# Patient Record
Sex: Female | Born: 1935 | Race: White | Hispanic: No | Smoking: Never smoker
Health system: Southern US, Community
[De-identification: ages and names within clinical notes are randomized; demographics above are authoritative.]

## PROBLEM LIST (undated history)

## (undated) DIAGNOSIS — M1990 Unspecified osteoarthritis, unspecified site: Secondary | ICD-10-CM

## (undated) DIAGNOSIS — H269 Unspecified cataract: Secondary | ICD-10-CM

## (undated) DIAGNOSIS — I509 Heart failure, unspecified: Secondary | ICD-10-CM

## (undated) HISTORY — PX: BREAST BIOPSY: SHX20

## (undated) HISTORY — DX: Unspecified cataract: H26.9

## (undated) HISTORY — DX: Unspecified osteoarthritis, unspecified site: M19.90

## (undated) HISTORY — DX: Heart failure, unspecified: I50.9

---
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
## 2001-04-13 HISTORY — PX: REPLACEMENT TOTAL KNEE BILATERAL: SUR1225

## 2003-04-14 HISTORY — PX: BACK SURGERY: SHX140

## 2016-11-06 DIAGNOSIS — I890 Lymphedema, not elsewhere classified: Secondary | ICD-10-CM | POA: Insufficient documentation

## 2016-11-06 DIAGNOSIS — M79605 Pain in left leg: Secondary | ICD-10-CM | POA: Insufficient documentation

## 2019-12-30 DIAGNOSIS — Z20822 Contact with and (suspected) exposure to covid-19: Secondary | ICD-10-CM | POA: Insufficient documentation

## 2020-10-25 ENCOUNTER — Telehealth: Payer: Self-pay

## 2020-10-25 NOTE — Telephone Encounter (Signed)
Yes it is ok to schedule a new patient appointment.  Katelyn Estes. Jimmey Ralph, MD 10/25/2020 12:29 PM

## 2020-10-25 NOTE — Telephone Encounter (Signed)
Patient has been scheduled for 01/24/21 

## 2020-10-25 NOTE — Telephone Encounter (Signed)
Patient is calling in wanting to establish with Dr.Parker, was recommended to him by Dr.Bagely. Okay to schedule appointment?

## 2020-12-06 ENCOUNTER — Telehealth: Payer: Self-pay | Admitting: Cardiovascular Disease

## 2020-12-06 NOTE — Telephone Encounter (Signed)
Patient called and made mention that she has been trying for weeks to get scheduled and get referral into office by previous cardiologist in [place]. Says that she finally got in contact with office and they need a request to send medical records and referral to office. Said to send in request via fax to [phone]; attn: Dr. Virgina Organ

## 2020-12-09 ENCOUNTER — Encounter: Payer: Self-pay | Admitting: *Deleted

## 2020-12-09 ENCOUNTER — Other Ambulatory Visit: Payer: Self-pay | Admitting: *Deleted

## 2020-12-11 ENCOUNTER — Telehealth: Payer: Self-pay

## 2020-12-11 NOTE — Telephone Encounter (Signed)
Katelyn Estes called in stating that she is a new pt with Dr Jimmey Ralph. New pt appt is scheduled for 01/13/21. [name] called that we need to request her records from her previous doctor in [place]. Pt stated that she has brought in new pt paper work already. [name] stated that the ROI needs to be faxed to [phone] from Dr Tresa Endo. Pt would also like to be seen before 10/3. Please Advise.

## 2020-12-11 NOTE — Telephone Encounter (Signed)
Ok to see pt before 10/03?

## 2020-12-12 NOTE — Telephone Encounter (Signed)
Referral notes received from [facility], Phone #: [phone], Fax #: [phone]   A copy of the notes have been placed in the scheduling box for check-out to pick-up and to enter referral. Original notes placed in file cabinet.   Sent a request for records from previous cardiologist in [place]. Fax [phone]: attn Dr. Virgina Organ

## 2020-12-17 NOTE — Telephone Encounter (Signed)
Doesn't look like we have any 40 min slots before her appt.

## 2020-12-17 NOTE — Telephone Encounter (Signed)
Ok to put her in a 40 minute slot if we have anything available before October.  Katina Degree. Jimmey Ralph, MD 12/17/2020 7:58 AM

## 2020-12-17 NOTE — Telephone Encounter (Signed)
See below

## 2020-12-23 ENCOUNTER — Telehealth: Payer: Self-pay

## 2020-12-23 NOTE — Telephone Encounter (Signed)
NOTES ON FILE FROM [facility] [phone]

## 2021-01-13 ENCOUNTER — Ambulatory Visit (INDEPENDENT_AMBULATORY_CARE_PROVIDER_SITE_OTHER): Payer: Medicare Other | Admitting: Family Medicine

## 2021-01-13 ENCOUNTER — Other Ambulatory Visit: Payer: Self-pay

## 2021-01-13 ENCOUNTER — Telehealth: Payer: Self-pay

## 2021-01-13 ENCOUNTER — Encounter: Payer: Self-pay | Admitting: Family Medicine

## 2021-01-13 VITALS — BP 114/75 | HR 95 | Temp 98.5°F | Ht 62.0 in | Wt 191.8 lb

## 2021-01-13 DIAGNOSIS — L989 Disorder of the skin and subcutaneous tissue, unspecified: Secondary | ICD-10-CM | POA: Diagnosis not present

## 2021-01-13 DIAGNOSIS — E538 Deficiency of other specified B group vitamins: Secondary | ICD-10-CM | POA: Diagnosis not present

## 2021-01-13 DIAGNOSIS — E785 Hyperlipidemia, unspecified: Secondary | ICD-10-CM | POA: Diagnosis not present

## 2021-01-13 DIAGNOSIS — G4700 Insomnia, unspecified: Secondary | ICD-10-CM

## 2021-01-13 DIAGNOSIS — R5383 Other fatigue: Secondary | ICD-10-CM

## 2021-01-13 DIAGNOSIS — J309 Allergic rhinitis, unspecified: Secondary | ICD-10-CM

## 2021-01-13 DIAGNOSIS — I25119 Atherosclerotic heart disease of native coronary artery with unspecified angina pectoris: Secondary | ICD-10-CM

## 2021-01-13 DIAGNOSIS — R739 Hyperglycemia, unspecified: Secondary | ICD-10-CM | POA: Diagnosis not present

## 2021-01-13 DIAGNOSIS — I2510 Atherosclerotic heart disease of native coronary artery without angina pectoris: Secondary | ICD-10-CM | POA: Insufficient documentation

## 2021-01-13 DIAGNOSIS — I890 Lymphedema, not elsewhere classified: Secondary | ICD-10-CM | POA: Insufficient documentation

## 2021-01-13 DIAGNOSIS — E559 Vitamin D deficiency, unspecified: Secondary | ICD-10-CM | POA: Diagnosis not present

## 2021-01-13 DIAGNOSIS — I10 Essential (primary) hypertension: Secondary | ICD-10-CM

## 2021-01-13 DIAGNOSIS — M1990 Unspecified osteoarthritis, unspecified site: Secondary | ICD-10-CM

## 2021-01-13 DIAGNOSIS — K5900 Constipation, unspecified: Secondary | ICD-10-CM

## 2021-01-13 DIAGNOSIS — G4733 Obstructive sleep apnea (adult) (pediatric): Secondary | ICD-10-CM

## 2021-01-13 LAB — LIPID PANEL
Cholesterol: 185 mg/dL (ref 0–200)
HDL: 76.8 mg/dL (ref >39.00)
LDL Cholesterol: 88 mg/dL (ref 0–99)
NonHDL: 108.11
Total CHOL/HDL Ratio: 2
Triglycerides: 99 mg/dL (ref 0.0–149.0)
VLDL: 19.8 mg/dL (ref 0.0–40.0)

## 2021-01-13 LAB — COMPREHENSIVE METABOLIC PANEL
ALT: 15 U/L (ref 0–35)
AST: 17 U/L (ref 0–37)
Albumin: 4 g/dL (ref 3.5–5.2)
Alkaline Phosphatase: 57 U/L (ref 39–117)
BUN: 12 mg/dL (ref 6–23)
CO2: 28 mEq/L (ref 19–32)
Calcium: 9.4 mg/dL (ref 8.4–10.5)
Chloride: 102 mEq/L (ref 96–112)
Creatinine, Ser: 0.62 mg/dL (ref 0.40–1.20)
GFR: 79.62 mL/min (ref >60.00)
Glucose, Bld: 81 mg/dL (ref 70–99)
Potassium: 4.1 mEq/L (ref 3.5–5.1)
Sodium: 140 mEq/L (ref 135–145)
Total Bilirubin: 0.4 mg/dL (ref 0.2–1.2)
Total Protein: 6.1 g/dL (ref 6.0–8.3)

## 2021-01-13 LAB — HEMOGLOBIN A1C: Hgb A1c MFr Bld: 5.9 % (ref 4.6–6.5)

## 2021-01-13 LAB — CBC
HCT: 41.2 % (ref 36.0–46.0)
Hemoglobin: 13.6 g/dL (ref 12.0–15.0)
MCHC: 33 g/dL (ref 30.0–36.0)
MCV: 95.8 fl (ref 78.0–100.0)
Platelets: 230 10*3/uL (ref 150.0–400.0)
RBC: 4.31 Mil/uL (ref 3.87–5.11)
RDW: 13.3 % (ref 11.5–15.5)
WBC: 4.1 10*3/uL (ref 4.0–10.5)

## 2021-01-13 LAB — VITAMIN B12: Vitamin B-12: 211 pg/mL (ref 211–911)

## 2021-01-13 LAB — VITAMIN D 25 HYDROXY (VIT D DEFICIENCY, FRACTURES): VITD: 40.4 ng/mL (ref 30.00–100.00)

## 2021-01-13 LAB — TSH: TSH: 2.03 u[IU]/mL (ref 0.35–5.50)

## 2021-01-13 MED ORDER — AZELASTINE HCL 0.1 % NA SOLN: 2.0000 | Freq: Two times a day (BID) | NASAL | 0 refills | Status: DC

## 2021-01-13 NOTE — Assessment & Plan Note (Signed)
Check vitamin D. 

## 2021-01-13 NOTE — Assessment & Plan Note (Signed)
Needs assistive devices to walk.  Managed with over-the-counter meds.  We will fill out handicap placard form.

## 2021-01-13 NOTE — Assessment & Plan Note (Signed)
Discussed sleep hygiene measures.  Recommend avoidance of caffeine.  She can try melatonin over-the-counter as needed.

## 2021-01-13 NOTE — Assessment & Plan Note (Signed)
Uses over-the-counter laxative.  Symptoms are currently stable.

## 2021-01-13 NOTE — Telephone Encounter (Signed)
FYI:  Patient wrote in two previous MD's on DPR thinking that the DPR was a medical records release form.   Patient is going to come back into the office at a later date to complete both.

## 2021-01-13 NOTE — Assessment & Plan Note (Signed)
Start Astelin nasal spray.   

## 2021-01-13 NOTE — Assessment & Plan Note (Signed)
Managed conservatively.  Uses sequential compression devices at home.

## 2021-01-13 NOTE — Assessment & Plan Note (Signed)
Does not use CPAP. 

## 2021-01-13 NOTE — Assessment & Plan Note (Signed)
Has been prescribed pravastatin in the past but is currently no longer on any medication for this.  We will check labs.

## 2021-01-13 NOTE — Assessment & Plan Note (Signed)
At goal off medications. 

## 2021-01-13 NOTE — Progress Notes (Signed)
Katelyn Estes is a 87 y.o. female who presents today for an office visit.  Assessment/Plan:  New/Acute Problems: Skin Lesion Will place referral to dermatology.  Likely benign epidermal cyst.  Chronic Problems Addressed Today: Allergic rhinitis Start Astelin nasal spray.  Vitamin D deficiency Check vitamin D.  Lymphedema Managed conservatively.  Uses sequential compression devices at home.  Constipation Uses over-the-counter laxative.  Symptoms are currently stable.  Osteoarthritis Needs assistive devices to walk.  Managed with over-the-counter meds.  We will fill out handicap placard form.   OSA (obstructive sleep apnea) Does not use CPAP.   Hyperglycemia Check A1c.   Dyslipidemia Has been prescribed pravastatin in the past but is currently no longer on any medication for this.  We will check labs.  Coronary artery disease We following up with cardiology soon.  She is on Plavix 75 mg daily.  Essential hypertension At goal off medications.   Insomnia Discussed sleep hygiene measures.  Recommend avoidance of caffeine.  She can try melatonin over-the-counter as needed.     Subjective:  HPI:  Patient is here to establish care.  Recently relocated to the area.  See A/P for status of chronic conditions.  ROS: Per HPI, otherwise a complete review of systems was negative.   PMH:  The following were reviewed and entered/updated in epic: Past Medical History:  Diagnosis Date   Arthritis    Cataracts, bilateral    Congestive heart disease (HCC)    Patient Active Problem List   Diagnosis Date Noted   Essential hypertension 01/13/2021   Coronary artery disease 01/13/2021   Dyslipidemia 01/13/2021   Hyperglycemia 01/13/2021   OSA (obstructive sleep apnea) 01/13/2021   Osteoarthritis 01/13/2021   Constipation 01/13/2021   Lymphedema 01/13/2021   Vitamin D deficiency 01/13/2021   Allergic rhinitis 01/13/2021   Insomnia 01/13/2021   Past Surgical History:   Procedure Laterality Date   BACK SURGERY  2005   REPLACEMENT TOTAL KNEE BILATERAL  2003   TONSILLECTOMY  1939    History reviewed. No pertinent family history.  Medications- reviewed and updated Current Outpatient Medications  Medication Sig Dispense Refill   ammonium lactate (LAC-HYDRIN) 12 % lotion Apply topically daily.     azelastine (ASTELIN) 0.1 % nasal spray Place 2 sprays into both nostrils 2 (two) times daily. 30 mL 0   clopidogrel (PLAVIX) 75 MG tablet Take 75 mg by mouth daily.     zinc gluconate 50 MG tablet Take 50 mg by mouth daily.     No current facility-administered medications for this visit.    Allergies-reviewed and updated No Known Allergies  Social History   Socioeconomic History   Marital status: Widowed    Spouse name: Not on file   Number of children: Not on file   Years of education: Not on file   Highest education level: Not on file  Occupational History   Not on file  Tobacco Use   Smoking status: Never   Smokeless tobacco: Not on file  Substance and Sexual Activity   Alcohol use: Yes    Comment: occ   Drug use: Never   Sexual activity: Not on file  Other Topics Concern   Not on file  Social History Narrative   Not on file   Social Determinants of Health   Financial Resource Strain: Not on file  Food Insecurity: Not on file  Transportation Needs: Not on file  Physical Activity: Not on file  Stress: Not on file  Social Connections: Not  on file          Objective:  Physical Exam: BP 114/75   Pulse 95   Temp 98.5 F (36.9 C) (Temporal)   Ht 5\' 2"  (1.575 m)   Wt 191 lb 12.8 oz (87 kg)   SpO2 95%   BMI 35.08 kg/m   Gen: No acute distress, resting comfortably CV: Regular rate and rhythm with no murmurs appreciated Pulm: Normal work of breathing, clear to auscultation bilaterally with no crackles, wheezes, or rhonchi Neuro: Grossly normal, moves all extremities Psych: Normal affect and thought content      Time  Spent: 65 minutes of total time was spent on the date of the encounter performing the following actions: chart review prior to seeing the patient including records from previous PCP, obtaining history, performing a medically necessary exam, counseling on the treatment plan, placing orders, and documenting in our EHR.    . Katina Degree, MD 01/13/2021 1:52 PM

## 2021-01-13 NOTE — Assessment & Plan Note (Signed)
Check A1c. 

## 2021-01-13 NOTE — Patient Instructions (Signed)
It was very nice to see you today!  Please try the Astelin nasal spray for your runny nose.  Please try taking melatonin to help you sleep.  We will check blood work today.  I will refer you to see a dermatologist.  I will see back in 6 months.  Please come back to see me sooner if needed.  Take care, Dr Jimmey Ralph  PLEASE NOTE:  If you had any lab tests please let us know if you have not heard back within a few days. You may see your results on mychart before we have a chance to review them but we will give you a call once they are reviewed by [place]. If we ordered any referrals today, please let us know if you have not heard from their office within the next week.   Please try these tips to maintain a healthy lifestyle:  Eat at least 3 REAL meals and 1-2 snacks per day.  Aim for no more than 5 hours between eating.  If you eat breakfast, please do so within one hour of getting up.   Each meal should contain half fruits/vegetables, one quarter protein, and one quarter carbs (no bigger than a computer mouse)  Cut down on sweet beverages. This includes juice, soda, and sweet tea.   Drink at least 1 glass of water with each meal and aim for at least 8 glasses per day  Exercise at least 150 minutes every week.

## 2021-01-13 NOTE — Assessment & Plan Note (Signed)
We following up with cardiology soon.  She is on Plavix 75 mg daily.

## 2021-01-14 NOTE — Progress Notes (Signed)
Please inform patient of the following:  Her B12 is low. This could explain some of her symptoms. Recommend starting B12 protocol. Everything else is NORMAL. We can recheck everything else in a year.

## 2021-01-15 ENCOUNTER — Telehealth: Payer: Self-pay

## 2021-01-15 NOTE — Telephone Encounter (Signed)
Called and spoke with pt and labs reviewed. 

## 2021-01-15 NOTE — Telephone Encounter (Signed)
Patient is returning a call about lab results.  

## 2021-01-16 ENCOUNTER — Telehealth: Payer: Self-pay

## 2021-01-16 DIAGNOSIS — I890 Lymphedema, not elsewhere classified: Secondary | ICD-10-CM

## 2021-01-16 NOTE — Telephone Encounter (Signed)
We can refer to wound care clinic if she wishes.  Katina Degree. Jimmey Ralph, MD 01/16/2021 1:26 PM

## 2021-01-16 NOTE — Telephone Encounter (Signed)
Patient was seen on 10/3 and discussed referral and would like to gt a referral to a dr. To help with her lymphoedema in her legs  Please advise

## 2021-01-16 NOTE — Telephone Encounter (Signed)
Who would this referral be sent to?

## 2021-01-16 NOTE — Telephone Encounter (Signed)
Referral to wound care has been placed.

## 2021-01-20 ENCOUNTER — Telehealth: Payer: Self-pay

## 2021-01-20 ENCOUNTER — Telehealth: Payer: Self-pay | Admitting: Physician Assistant

## 2021-01-20 NOTE — Telephone Encounter (Signed)
The [facility] website says that the [facility] will treat lymphedema. Ok to place referral there.  Katina Degree. Jimmey Ralph, MD 01/20/2021 2:43 PM

## 2021-01-20 NOTE — Telephone Encounter (Signed)
See below, you had me refer to wound clinic last week.

## 2021-01-20 NOTE — Telephone Encounter (Signed)
Patient is calling in stating that she wanted a referral for her lymphedema but keeps getting referred to places that do not treat it. Wanting to know what she needs to do.

## 2021-01-20 NOTE — Telephone Encounter (Signed)
Notes documented and referral marked as "patient refusal" that will automatically send referral back to referring office.

## 2021-01-20 NOTE — Telephone Encounter (Signed)
Do you know how I place/order this referral?

## 2021-01-20 NOTE — Telephone Encounter (Signed)
Patient canceled appointment because she was to be seen for lymphodema by someone else.  Patient states she will contact Dr. Lavone Neri office to get referral for that.

## 2021-01-24 ENCOUNTER — Ambulatory Visit: Payer: Self-pay | Admitting: Family Medicine

## 2021-01-29 NOTE — Telephone Encounter (Signed)
Patient called in saying she has already contacted [place] before about the referral, she has gotten another call from [facility] that they cant see her and patient would like for someone to give her a call about an update.

## 2021-02-03 ENCOUNTER — Ambulatory Visit (INDEPENDENT_AMBULATORY_CARE_PROVIDER_SITE_OTHER): Payer: Medicare Other | Admitting: Orthopedic Surgery

## 2021-02-03 ENCOUNTER — Encounter: Payer: Self-pay | Admitting: Orthopedic Surgery

## 2021-02-03 DIAGNOSIS — I25119 Atherosclerotic heart disease of native coronary artery with unspecified angina pectoris: Secondary | ICD-10-CM

## 2021-02-03 DIAGNOSIS — I890 Lymphedema, not elsewhere classified: Secondary | ICD-10-CM

## 2021-02-03 NOTE — Progress Notes (Signed)
Office Visit Note   Patient: Katelyn Estes           Date of Birth: March 17, 1931           MRN: [id number] Visit Date: 02/03/2021              Requested by: Ardith Dark, MD [address] [place],  [place] [address] PCP: Ardith Dark, MD  Chief Complaint  Patient presents with   Right Leg - Pain   Left Leg - Pain      HPI: Patient is a 87 year old woman who was seen for initial evaluation for lymphedema of both lower extremities.  Patient has been using a lymphedema pump since 2016 patient states the pump and compression garments no longer work properly.  Assessment & Plan: Visit Diagnoses:  1. Lymphedema     Plan: Patient is provided a prescription for new pump and lymphedema garments.  Recommended also use a compression and using a stationary bike to work the calf pump.  Follow-Up Instructions: Return if symptoms worsen or fail to improve.   Ortho Exam  Patient is alert, oriented, no adenopathy, well-dressed, normal affect, normal respiratory effort. Examination patient does not have brawny skin color changes she does have pitting edema both lower extremities there is no redness no cellulitis she has a palpable pulse bilaterally.  Patient is status post bilateral total knees.  She has been using a lymphedema pump since 2016 but they no longer work.  Imaging: No results found. No images are attached to the encounter.  Labs: Lab Results  Component Value Date   HGBA1C 5.9 01/13/2021     Lab Results  Component Value Date   ALBUMIN 4.0 01/13/2021    No results found for: MG Lab Results  Component Value Date   VD25OH 40.40 01/13/2021    No results found for: PREALBUMIN CBC EXTENDED Latest Ref Rng & Units 01/13/2021  WBC 4.0 - 10.5 K/uL 4.1  RBC 3.87 - 5.11 Mil/uL 4.31  HGB 12.0 - 15.0 g/dL 66.4  HCT 40.3 - 47.4 % 41.2  PLT 150.0 - 400.0 K/uL 230.0     There is no height or weight on file to calculate BMI.  Orders:  No orders of the defined types were  placed in this encounter.  No orders of the defined types were placed in this encounter.    Procedures: No procedures performed  Clinical Data: No additional findings.  ROS:  All other systems negative, except as noted in the HPI. Review of Systems  Objective: Vital Signs: There were no vitals taken for this visit.  Specialty Comments:  No specialty comments available.  PMFS History: Patient Active Problem List   Diagnosis Date Noted   Essential hypertension 01/13/2021   Coronary artery disease 01/13/2021   Dyslipidemia 01/13/2021   Hyperglycemia 01/13/2021   OSA (obstructive sleep apnea) 01/13/2021   Osteoarthritis 01/13/2021   Constipation 01/13/2021   Lymphedema 01/13/2021   Vitamin D deficiency 01/13/2021   Allergic rhinitis 01/13/2021   Insomnia 01/13/2021   Past Medical History:  Diagnosis Date   Arthritis    Cataracts, bilateral    Congestive heart disease (HCC)     History reviewed. No pertinent family history.  Past Surgical History:  Procedure Laterality Date   BACK SURGERY  2005   REPLACEMENT TOTAL KNEE BILATERAL  2003   TONSILLECTOMY  1939   Social History   Occupational History   Not on file  Tobacco Use   Smoking status: Never  Smokeless tobacco: Not on file  Substance and Sexual Activity   Alcohol use: Yes    Comment: occ   Drug use: Never   Sexual activity: Not on file

## 2021-02-05 ENCOUNTER — Other Ambulatory Visit: Payer: Self-pay | Admitting: Family Medicine

## 2021-02-11 NOTE — Progress Notes (Signed)
CARDIOLOGY CONSULT NOTE       Patient ID: Katelyn Estes MRN: [id number] DOB/AGE: 11-27-1930 87 y.o.  Admit date: (Not on file) Referring Physician: Jimmey Ralph Primary Physician: Ardith Dark, MD Primary Cardiologist: New Reason for Consultation: CAD/Lymphedema  Active Problems:   * No active hospital problems. *   HPI:  87 y.o. referred by Dr Jimmey Ralph for CAD. She has chronic lymphedema using pump since 2016 and has seen Dr Lajoyce Corners , wound center and oncology lymphedema center Reviewed notes from [facility] History of stent to mid LAD.  Last cath 2017 with patent stent and moderate dx in proximal LAD and proximal RCA. FFR negative stent patent March 2021 lexiscan myovue normal Echo's have shown normal EF and trace MR. Chronic LE edema, dyspnea and atypical sharp chest pains better with Ranexa Tends to be bradycardic previous monitor with no AV block and self limited SVT. She is overweight and has OSA with recent use of CPAP  Lab review normal including CBC/BMET LFTs with LDL 88  HR is high not on beta blocker Needs new nitro No angina  Use to work for [employer] in [employer] Widowed over 45 years Has 4 daughters 3 in [place] And moved to town home here. Having trouble getting socially integrated and finding People to play cards with   ROS All other systems reviewed and negative except as noted above  Past Medical History:  Diagnosis Date   Arthritis    Cataracts, bilateral    Congestive heart disease (HCC)     No family history on file.  Social History   Socioeconomic History   Marital status: Widowed    Spouse name: Not on file   Number of children: Not on file   Years of education: Not on file   Highest education level: Not on file  Occupational History   Not on file  Tobacco Use   Smoking status: Never   Smokeless tobacco: Never  Substance and Sexual Activity   Alcohol use: Yes    Comment: occ   Drug use: Never   Sexual activity: Not on file  Other Topics Concern   Not on  file  Social History Narrative   Not on file   Social Determinants of Health   Financial Resource Strain: Not on file  Food Insecurity: Not on file  Transportation Needs: Not on file  Physical Activity: Not on file  Stress: Not on file  Social Connections: Not on file  Intimate Partner Violence: Not on file    Past Surgical History:  Procedure Laterality Date   BACK SURGERY  2005   REPLACEMENT TOTAL KNEE BILATERAL  2003   TONSILLECTOMY  1939      Current Outpatient Medications:    ammonium lactate (LAC-HYDRIN) 12 % lotion, Apply topically daily., Disp: , Rfl:    clopidogrel (PLAVIX) 75 MG tablet, Take 75 mg by mouth daily., Disp: , Rfl:    zinc gluconate 50 MG tablet, Take 50 mg by mouth daily. (Patient not taking: Reported on 02/18/2021), Disp: , Rfl:     Physical Exam: Blood pressure 110/64, pulse 94, height 5\' 2"  (1.575 m), weight 190 lb (86.2 kg), SpO2 97 %.    Affect appropriate Overweight elderly female  HEENT: normal Neck supple with no adenopathy JVP normal no bruits no thyromegaly Lungs clear with no wheezing and good diaphragmatic motion Heart:  S1/S2 no murmur, no rub, gallop or click PMI normal Abdomen: benighn, BS positve, no tenderness, no AAA no bruit.  No  HSM or HJR Distal pulses intact with no bruits Bilateral lymphedema prominent over feet Neuro non-focal Skin warm and dry No muscular weakness   Labs:   Lab Results  Component Value Date   WBC 4.1 01/13/2021   HGB 13.6 01/13/2021   HCT 41.2 01/13/2021   MCV 95.8 01/13/2021   PLT 230.0 01/13/2021   No results for input(s): NA, K, CL, CO2, BUN, CREATININE, CALCIUM, PROT, BILITOT, ALKPHOS, ALT, AST, GLUCOSE in the last 168 hours.  Invalid input(s): LABALBU No results found for: CKTOTAL, CKMB, CKMBINDEX, TROPONINI  Lab Results  Component Value Date   CHOL 185 01/13/2021   Lab Results  Component Value Date   HDL 76.80 01/13/2021   Lab Results  Component Value Date   LDLCALC 88  01/13/2021   Lab Results  Component Value Date   TRIG 99.0 01/13/2021   Lab Results  Component Value Date   CHOLHDL 2 01/13/2021   No results found for: LDLDIRECT    Radiology: No results found.  EKG: SR rate 94 LAD    ASSESSMENT AND PLAN:   CAD: stent to mid LAD with non ischemic myovue March 2021 Historically on plavix monotherapy Start coreg 3.125 bid New nitro called in  Lymphedema:  Not on diuretic sees lymphedema clinic has pump will f/u with wound center Indicates getting a new machine next week  OSA:  discussed weight loss and CPAP Bradycardia:  history of Tachycardic today see above regarding low dose coreg  HLD:  LDL 88  f/u primary defers statin given age   Coreg 3.125 bid Nitro  F/U 3 months   Signed: Charlton Haws 02/18/2021, 10:28 AM

## 2021-02-18 ENCOUNTER — Encounter: Payer: Self-pay | Admitting: Cardiovascular Disease

## 2021-02-18 ENCOUNTER — Ambulatory Visit (INDEPENDENT_AMBULATORY_CARE_PROVIDER_SITE_OTHER): Payer: Medicare Other | Admitting: Cardiovascular Disease

## 2021-02-18 ENCOUNTER — Other Ambulatory Visit: Payer: Self-pay

## 2021-02-18 VITALS — BP 110/64 | HR 94 | Ht 62.0 in | Wt 190.0 lb

## 2021-02-18 DIAGNOSIS — I10 Essential (primary) hypertension: Secondary | ICD-10-CM

## 2021-02-18 DIAGNOSIS — I25119 Atherosclerotic heart disease of native coronary artery with unspecified angina pectoris: Secondary | ICD-10-CM | POA: Diagnosis not present

## 2021-02-18 DIAGNOSIS — I890 Lymphedema, not elsewhere classified: Secondary | ICD-10-CM | POA: Diagnosis not present

## 2021-02-18 MED ORDER — CARVEDILOL 3.125 MG PO TABS: 3.1250 mg | ORAL_TABLET | Freq: Two times a day (BID) | ORAL | 3 refills | Status: DC

## 2021-02-18 MED ORDER — NITROGLYCERIN 0.4 MG SL SUBL: 0.4000 mg | SUBLINGUAL_TABLET | SUBLINGUAL | 3 refills | Status: DC | PRN

## 2021-02-18 NOTE — Patient Instructions (Addendum)
Medication Instructions:  Your physician has recommended you make the following change in your medication:  1-START Carvedilol 3.125 mg by mouth twice daily (Take only once a day for the first 2 weeks) 2-Take 1 nitroglycerin (NTG), under your tongue, while sitting.  If no relief of pain may repeat NTG, one tab every 5 minutes up to 3 tablets total over 15 minutes.  If no relief CALL 911.  If you have dizziness/lightheadness  while taking NTG, stop taking and call 911.    *If you need a refill on your cardiac medications before your next appointment, please call your pharmacy*  Lab Work: If you have labs (blood work) drawn today and your tests are completely normal, you will receive your results only by: MyChart Message (if you have MyChart) OR A paper copy in the mail If you have any lab test that is abnormal or we need to change your treatment, we will call you to review the results.  Testing/Procedures: None ordered at this time.  Follow-Up: At [facility], you and your health needs are our priority.  As part of our continuing mission to provide you with exceptional heart care, we have created designated Provider Care Teams.  These Care Teams include your primary Cardiologist (physician) and Advanced Practice Providers (APPs -  Physician Assistants and Nurse Practitioners) who all work together to provide you with the care you need, when you need it.  We recommend signing up for the patient portal called "MyChart".  Sign up information is provided on this After Visit Summary.  MyChart is used to connect with patients for Virtual Visits (Telemedicine).  Patients are able to view lab/test results, encounter notes, upcoming appointments, etc.  Non-urgent messages can be sent to your provider as well.   To learn more about what you can do with MyChart, go to [email or web address].    Your next appointment:   3 months  The format for your next appointment:   In Person  Provider:    Charlton Haws, MD

## 2021-02-24 ENCOUNTER — Telehealth: Payer: Self-pay | Admitting: Orthopedic Surgery

## 2021-02-24 NOTE — Telephone Encounter (Signed)
I emailed Megan on Friday and advised that I have printed the CMN and the rx and will have Dr. Lajoyce Corners to sign tonight and then will fax in the morning.

## 2021-02-24 NOTE — Telephone Encounter (Signed)
Christine with tactile states hey need a Dr.s signature for a device for the pt. She would like a [employer] CB# [phone]

## 2021-02-26 ENCOUNTER — Telehealth: Payer: Self-pay | Admitting: Cardiovascular Disease

## 2021-02-26 MED ORDER — ATENOLOL 25 MG PO TABS: 25.0000 mg | ORAL_TABLET | Freq: Every day | ORAL | 3 refills | Status: DC

## 2021-02-26 NOTE — Telephone Encounter (Signed)
Per Dr. Eden Emms, [facility] to stop see if she can take Atenolol 25 mg once a day can start at night. Called patient back with recommendations. Patient verbalized understanding.

## 2021-02-26 NOTE — Telephone Encounter (Signed)
Called patient back about her message. Patient stated she is having a hard time taking coreg once a day. Patient stated after she started it she has been sleepy all the time and had a dull headache on her left side. Patient has been taking medication in the morning and with food. Will forward to Dr. Eden Emms for further advisement.

## 2021-02-26 NOTE — Telephone Encounter (Signed)
Pt c/o medication issue:  1. Name of Medication: carvedilol (COREG) 3.125 MG tablet  2. How are you currently taking this medication (dosage and times per day)? One tablet daily in the morning  3. Are you having a reaction (difficulty breathing--STAT)? Headaches, tiredness  4. What is your medication issue? Patient doesn't like the side effects from this medication. She wanted to know if there is anything else she can take

## 2021-03-13 ENCOUNTER — Other Ambulatory Visit: Payer: Self-pay | Admitting: Family Medicine

## 2021-03-13 DIAGNOSIS — Z1231 Encounter for screening mammogram for malignant neoplasm of breast: Secondary | ICD-10-CM

## 2021-04-16 ENCOUNTER — Ambulatory Visit (INDEPENDENT_AMBULATORY_CARE_PROVIDER_SITE_OTHER): Payer: Medicare Other | Admitting: Family Medicine

## 2021-04-16 ENCOUNTER — Encounter: Payer: Self-pay | Admitting: Family Medicine

## 2021-04-16 ENCOUNTER — Ambulatory Visit: Payer: Medicare Other | Admitting: Family Medicine

## 2021-04-16 ENCOUNTER — Other Ambulatory Visit: Payer: Self-pay

## 2021-04-16 VITALS — BP 118/80 | HR 65 | Temp 97.6°F | Wt 189.8 lb

## 2021-04-16 DIAGNOSIS — G4700 Insomnia, unspecified: Secondary | ICD-10-CM

## 2021-04-16 DIAGNOSIS — G500 Trigeminal neuralgia: Secondary | ICD-10-CM

## 2021-04-16 DIAGNOSIS — I10 Essential (primary) hypertension: Secondary | ICD-10-CM | POA: Diagnosis not present

## 2021-04-16 NOTE — Progress Notes (Signed)
° °  Katelyn Estes is a 88 y.o. female who presents today for an office visit.  Assessment/Plan:  Chronic Problems Addressed Today: Trigeminal neuralgia Symptoms are currently mild and manageable. We discussed treatment options. Given relatively mild symptoms at this point she would like to defer starting any medications. Discussed reasons to follow up and return to care.   Insomnia We reiterated sleep hygiene measures. She is an avid caffeine drinker and routinely has the tv on. She does not wish to try medications at this point.   Essential hypertension At goal on atenolol 25mg  daily.      Subjective:  HPI:  Patient here with left-sided facial pain.  This has been going on for months.  Stable over that time.  She feels like her "eyes falling out".  Comes and goes.  She was recently diagnosed with actinic keratosis by her dermatologist and she is concerned that this may be contributing.  No specific treatments tried.  Pain is worse with pressing on the left side of her face.  Occasional electric-like sensation.  No weakness.  No difficulty with speech or swallowing  She also still has ongoing issues with sleep.  She will stay up to 1:59 AM routinely.  Will sleep for about 3 hours and then wake up and have to urinate.  She has been doing this for several years.  She is an avid coffee drinker.  She has the tv on 24 hours a day. She does not want to try medications.        Objective:  Physical Exam: BP 118/80    Pulse 65    Temp 97.6 F (36.4 C) (Temporal)    Wt 189 lb 12.8 oz (86.1 kg)    SpO2 96%    BMI 34.71 kg/m   Gen: No acute distress, resting comfortably CV: Regular rate and rhythm with no murmurs appreciated Pulm: Normal work of breathing, clear to auscultation bilaterally with no crackles, wheezes, or rhonchi Neuro: moves all extremities. Tinel's sign positive at left TMJ - this reproduces pain.  Psych: Normal affect and thought content      Katelyn Estes M. , MD 04/16/2021  8:26 AM

## 2021-04-16 NOTE — Patient Instructions (Signed)
It was very nice to see you today!  You have trigeminal neuralgia.  This is irritation in the nerve in your face.  This is a benign condition.  Please let me know if your symptoms worsen or if you would like to start a medication.  I will see back in 6 months.  Come back sooner if needed.  Take care, Dr Jimmey Ralph  PLEASE NOTE:  If you had any lab tests please let us know if you have not heard back within a few days. You may see your results on mychart before we have a chance to review them but we will give you a call once they are reviewed by [place]. If we ordered any referrals today, please let us know if you have not heard from their office within the next week.   Please try these tips to maintain a healthy lifestyle:  Eat at least 3 REAL meals and 1-2 snacks per day.  Aim for no more than 5 hours between eating.  If you eat breakfast, please do so within one hour of getting up.   Each meal should contain half fruits/vegetables, one quarter protein, and one quarter carbs (no bigger than a computer mouse)  Cut down on sweet beverages. This includes juice, soda, and sweet tea.   Drink at least 1 glass of water with each meal and aim for at least 8 glasses per day  Exercise at least 150 minutes every week.    Trigeminal Neuralgia Trigeminal neuralgia is a nerve disorder that causes severe pain on one side of the face. The pain may last from a few seconds to several minutes, but it can happen hundreds of times a day. The pain is usually only on one side of the face. Symptoms may occur for days, weeks, or months and then go away for months or years. The pain may return and be worse than before. What are the causes? This condition may be caused by: Damage or pressure to a nerve in the head that is called the trigeminal nerve. An attack can be triggered by: Talking or chewing. Putting on makeup. Washing, shaving, or touching your face. Brushing your teeth. Blasts of hot or cold air. Primary  demyelinating disorders, such as multiple sclerosis. Tumors. What increases the risk? You are more likely to develop this condition if: You are 19-82 years old. You are female. What are the signs or symptoms? The main symptom of this condition is severe pain in the jaw, lips, eyes, nose, scalp, forehead, and face. How is this diagnosed? This condition is diagnosed with a physical exam. A CT scan or an MRI may be done to rule out other conditions that can cause facial pain. How is this treated? This condition may be treated with: Measures to avoid the things that trigger your symptoms. Prescription medicines such as anticonvulsants. Procedures such as ablation, thermal, or radiation therapy. Cognitive or behavioral therapy. Complementary therapies such as: Gentle, regular exercise or yoga. Meditation. Aromatherapy. Acupuncture. Surgery. This may be done in severe cases if other medical treatment does not provide relief. It may take up to one month for treatment to start relieving the pain. Follow these instructions at home: Managing pain  Learn as much as you can about how to manage your pain. Ask your health care provider if a pain specialist would be helpful. Consider talking with a mental health care provider about how to cope with the pain. Consider joining a pain support group. General instructions Take over-the-counter  and prescription medicines only as told by your health care provider. Avoid the things that trigger your symptoms. It may help to: Chew on the unaffected side of your mouth. Avoid touching your face. Avoid blasts of hot or cold air. Keep all follow-up visits. Where to find more information Facial Pain Association: facepain.org Contact a health care provider if: Your medicine is not helping your symptoms. You have side effects from the medicine used for treatment. You develop new, unexplained symptoms, such as: Double vision. Facial weakness or  numbness. Changes in hearing or balance. You feel depressed. Get help right away if: Your pain is severe and is not getting better. You develop suicidal thoughts. If you ever feel like you may hurt yourself or others, or have thoughts about taking your own life, get help right away. Go to your nearest emergency department or: Call your local emergency services (911 in the [place] Call a suicide crisis helpline, such as the National Suicide Prevention Lifeline at (713)637-1012 or 988 in the [place] This is open 24 hours a day in the [place] Text the Crisis Text Line at 409-563-6723 (in the [place] Summary Trigeminal neuralgia is a nerve disorder that causes severe pain on one side of the face. The pain may last from a few seconds to several minutes. This condition is caused by damage or pressure to a nerve in the head that is called the trigeminal nerve. Treatment may include avoiding the things that trigger your symptoms, taking medicines, or having procedures or surgery. It may take up to one month for treatment to start relieving the pain. Keep all follow-up visits. This information is not intended to replace advice given to you by your health care provider. Make sure you discuss any questions you have with your health care provider. Document Revised: 10/24/2020 Document Reviewed: 09/23/2020 Elsevier Patient Education  2022 [employer].

## 2021-04-16 NOTE — Assessment & Plan Note (Signed)
Symptoms are currently mild and manageable. We discussed treatment options. Given relatively mild symptoms at this point she would like to defer starting any medications. Discussed reasons to follow up and return to care.

## 2021-04-16 NOTE — Assessment & Plan Note (Signed)
At goal on atenolol 25 mg daily. 

## 2021-04-16 NOTE — Assessment & Plan Note (Signed)
We reiterated sleep hygiene measures. She is an avid caffeine drinker and routinely has the tv on. She does not wish to try medications at this point.

## 2021-04-22 ENCOUNTER — Ambulatory Visit: Payer: Medicare Other

## 2021-05-06 ENCOUNTER — Ambulatory Visit
Admission: RE | Admit: 2021-05-06 | Discharge: 2021-05-06 | Disposition: A | Discharge status: Home / Self Care | Payer: Medicare Other | Source: Ambulatory Visit | Attending: Family Medicine | Admitting: Family Medicine

## 2021-05-06 DIAGNOSIS — Z1231 Encounter for screening mammogram for malignant neoplasm of breast: Secondary | ICD-10-CM

## 2021-05-18 NOTE — Progress Notes (Signed)
CARDIOLOGY CONSULT NOTE       Patient ID: Katelyn Estes MRN: [id number] DOB/AGE: 1930/08/01 88 y.o.  Admit date: (Not on file) Referring Physician: Jimmey Ralph Primary Physician: Ardith Dark, MD Primary Cardiologist: New Reason for Consultation: CAD/Lymphedema  Active Problems:   * No active hospital problems. *    HPI:  88 y.o. referred by Dr Jimmey Ralph for CAD. She has chronic lymphedema using pump since 2016 and has seen Dr Lajoyce Corners , wound center and oncology lymphedema center Reviewed notes from [facility] History of stent to mid LAD.  Last cath 2017 with patent stent and moderate dx in proximal LAD and proximal RCA. FFR negative stent patent March 2021 lexiscan myovue normal Echo's have shown normal EF and trace MR. Chronic LE edema, dyspnea and atypical sharp chest pains better with Ranexa Tends to be bradycardic previous monitor with no AV block and self limited SVT. She is overweight and has OSA with recent use of CPAP  No angina Some trigeminal neuralgia and Insomnia   Use to work for [employer] in [employer] Widowed over 45 years Has 4 daughters 3 in [place] And moved to town home here. Having trouble getting socially integrated and finding People to play cards with   Lethargy with coreg now on atenolol   She does not like her new lymph machine Too cumbersome She lives at the [place] a few houses down from my father in law   ROS All other systems reviewed and negative except as noted above  Past Medical History:  Diagnosis Date   Arthritis    Cataracts, bilateral    Congestive heart disease (HCC)     History reviewed. No pertinent family history.  Social History   Socioeconomic History   Marital status: Widowed    Spouse name: Not on file   Number of children: Not on file   Years of education: Not on file   Highest education level: Not on file  Occupational History   Not on file  Tobacco Use   Smoking status: Never   Smokeless tobacco: Never  Substance and Sexual  Activity   Alcohol use: Yes    Comment: occ   Drug use: Never   Sexual activity: Not on file  Other Topics Concern   Not on file  Social History Narrative   Not on file   Social Determinants of Health   Financial Resource Strain: Not on file  Food Insecurity: Not on file  Transportation Needs: Not on file  Physical Activity: Not on file  Stress: Not on file  Social Connections: Not on file  Intimate Partner Violence: Not on file    Past Surgical History:  Procedure Laterality Date   BACK SURGERY  2005   REPLACEMENT TOTAL KNEE BILATERAL  2003   TONSILLECTOMY  1939      Current Outpatient Medications:    ammonium lactate (LAC-HYDRIN) 12 % lotion, Apply topically daily., Disp: , Rfl:    atenolol (TENORMIN) 25 MG tablet, Take 1 tablet (25 mg total) by mouth daily., Disp: 90 tablet, Rfl: 3   clopidogrel (PLAVIX) 75 MG tablet, Take 75 mg by mouth daily., Disp: , Rfl:    Cyanocobalamin (VITAMIN B 12 PO), Take 1 tablet by mouth daily., Disp: , Rfl:    nitroGLYCERIN (NITROSTAT) 0.4 MG SL tablet, Place 1 tablet (0.4 mg total) under the tongue every 5 (five) minutes as needed for chest pain., Disp: 25 tablet, Rfl: 3   VITAMIN D, CHOLECALCIFEROL, PO, Take 1 tablet  by mouth daily., Disp: , Rfl:    zinc gluconate 50 MG tablet, Take 50 mg by mouth daily. (Patient not taking: Reported on 05/26/2021), Disp: , Rfl:     Physical Exam: Blood pressure 102/68, pulse 68, height 5\' 2"  (1.575 m), weight 188 lb 9.6 oz (85.5 kg), SpO2 98 %.    Affect appropriate Overweight elderly female  HEENT: normal Neck supple with no adenopathy JVP normal no bruits no thyromegaly Lungs clear with no wheezing and good diaphragmatic motion Heart:  S1/S2 no murmur, no rub, gallop or click PMI normal Abdomen: benighn, BS positve, no tenderness, no AAA no bruit.  No HSM or HJR Distal pulses intact with no bruits Bilateral lymphedema prominent over feet Neuro non-focal Skin warm and dry No muscular  weakness   Labs:   Lab Results  Component Value Date   WBC 4.1 01/13/2021   HGB 13.6 01/13/2021   HCT 41.2 01/13/2021   MCV 95.8 01/13/2021   PLT 230.0 01/13/2021   No results for input(s): NA, K, CL, CO2, BUN, CREATININE, CALCIUM, PROT, BILITOT, ALKPHOS, ALT, AST, GLUCOSE in the last 168 hours.  Invalid input(s): LABALBU No results found for: CKTOTAL, CKMB, CKMBINDEX, TROPONINI  Lab Results  Component Value Date   CHOL 185 01/13/2021   Lab Results  Component Value Date   HDL 76.80 01/13/2021   Lab Results  Component Value Date   LDLCALC 88 01/13/2021   Lab Results  Component Value Date   TRIG 99.0 01/13/2021   Lab Results  Component Value Date   CHOLHDL 2 01/13/2021   No results found for: LDLDIRECT    Radiology: MM 3D SCREEN BREAST BILATERAL  Result Date: 05/06/2021 CLINICAL DATA:  Screening. EXAM: DIGITAL SCREENING BILATERAL MAMMOGRAM WITH TOMOSYNTHESIS AND CAD TECHNIQUE: Bilateral screening digital craniocaudal and mediolateral oblique mammograms were obtained. Bilateral screening digital breast tomosynthesis was performed. The images were evaluated with computer-aided detection. COMPARISON:  Previous exam(s). ACR Breast Density Category b: There are scattered areas of fibroglandular density. FINDINGS: There are no findings suspicious for malignancy. IMPRESSION: No mammographic evidence of malignancy. A result letter of this screening mammogram will be mailed directly to the patient. RECOMMENDATION: Screening mammogram in one year. (Code:SM-B-01Y) BI-RADS CATEGORY  1: Negative. Electronically Signed   By: 05/08/2021 M.D.   On: 05/06/2021 12:21    EKG: SR rate 94 LAD    ASSESSMENT AND PLAN:   CAD: stent to mid LAD with non ischemic myovue March 2021 Historically on plavix monotherapy continue beta blocker  Lymphedema:  Not on diuretic sees lymphedema clinic has pump will f/u with wound center Indicates getting a new machine   OSA:  discussed weight loss and  CPAP Bradycardia:  history of Tachycardic today see above regarding low dose atenolol  HLD:  LDL 88  f/u primary defers statin given age   F/U in a year    Signed: April 2021 05/26/2021, 9:27 AM

## 2021-05-26 ENCOUNTER — Ambulatory Visit (INDEPENDENT_AMBULATORY_CARE_PROVIDER_SITE_OTHER): Payer: Medicare Other | Admitting: Cardiovascular Disease

## 2021-05-26 ENCOUNTER — Encounter: Payer: Self-pay | Admitting: Cardiovascular Disease

## 2021-05-26 ENCOUNTER — Other Ambulatory Visit: Payer: Self-pay

## 2021-05-26 VITALS — BP 102/68 | HR 68 | Ht 62.0 in | Wt 188.6 lb

## 2021-05-26 DIAGNOSIS — I890 Lymphedema, not elsewhere classified: Secondary | ICD-10-CM

## 2021-05-26 DIAGNOSIS — I10 Essential (primary) hypertension: Secondary | ICD-10-CM

## 2021-05-26 DIAGNOSIS — I25119 Atherosclerotic heart disease of native coronary artery with unspecified angina pectoris: Secondary | ICD-10-CM

## 2021-05-26 NOTE — Patient Instructions (Signed)
Medication Instructions:  °Your physician recommends that you continue on your current medications as directed. Please refer to the Current Medication list given to you today. ° °*If you need a refill on your cardiac medications before your next appointment, please call your pharmacy* ° °Lab Work: °If you have labs (blood work) drawn today and your tests are completely normal, you will receive your results only by: °MyChart Message (if you have MyChart) OR °A paper copy in the mail °If you have any lab test that is abnormal or we need to change your treatment, we will call you to review the results. ° °Testing/Procedures: °None ordered today ° °Follow-Up: °At [facility], you and your health needs are our priority.  As part of our continuing mission to provide you with exceptional heart care, we have created designated Provider Care Teams.  These Care Teams include your primary Cardiologist (physician) and Advanced Practice Providers (APPs -  Physician Assistants and Nurse Practitioners) who all work together to provide you with the care you need, when you need it. ° °We recommend signing up for the patient portal called "MyChart".  Sign up information is provided on this After Visit Summary.  MyChart is used to connect with patients for Virtual Visits (Telemedicine).  Patients are able to view lab/test results, encounter notes, upcoming appointments, etc.  Non-urgent messages can be sent to your provider as well.   °To learn more about what you can do with MyChart, go to [email or web address].   ° °Your next appointment:   °1 year(s) ° °The format for your next appointment:   °In Person ° °Provider:   °Peter Nishan, MD { ° °

## 2021-05-27 ENCOUNTER — Telehealth: Payer: Self-pay | Admitting: Family Medicine

## 2021-05-27 NOTE — Telephone Encounter (Signed)
Please see note.

## 2021-05-27 NOTE — Telephone Encounter (Signed)
[facility] and they are wanting to do a light therapy on her face and pt was concerned due to recent root canal and roots being around nerves this needed to be ok by you before she proceeded.   Duante Arocho, CMA

## 2021-05-27 NOTE — Telephone Encounter (Signed)
Are they supposed to send a surgical clearance over? I have not gotten any records.  Algis Greenhouse. [name], MD 05/27/2021 3:29 PM

## 2021-05-27 NOTE — Telephone Encounter (Signed)
Pt is scheduled to have a cancer procedure done at [facility] with Jaclyn Prime. They are reluctant to do anything until they speak with Dr Jimmey Ralph. Pt stated she was diagnosed with nerve damage to the side of her face. Please advise  [facility] [phone]

## 2021-05-28 NOTE — Telephone Encounter (Signed)
It is fine for her to get light therapy.  Katina Degree. Jimmey Ralph, MD 05/28/2021 7:50 AM

## 2021-05-28 NOTE — Telephone Encounter (Signed)
Pt verified DOB and advised it is ok to get the light therapy and pt verbalized understanding.

## 2021-07-17 ENCOUNTER — Ambulatory Visit: Payer: Medicare Other | Admitting: Physician Assistant

## 2021-09-03 ENCOUNTER — Encounter: Payer: Self-pay | Admitting: Podiatry

## 2021-09-03 ENCOUNTER — Ambulatory Visit (INDEPENDENT_AMBULATORY_CARE_PROVIDER_SITE_OTHER): Payer: Medicare Other | Admitting: Podiatry

## 2021-09-03 DIAGNOSIS — M79675 Pain in left toe(s): Secondary | ICD-10-CM | POA: Diagnosis not present

## 2021-09-03 DIAGNOSIS — I25119 Atherosclerotic heart disease of native coronary artery with unspecified angina pectoris: Secondary | ICD-10-CM

## 2021-09-03 DIAGNOSIS — M79674 Pain in right toe(s): Secondary | ICD-10-CM

## 2021-09-03 DIAGNOSIS — B351 Tinea unguium: Secondary | ICD-10-CM | POA: Diagnosis not present

## 2021-09-03 NOTE — Progress Notes (Signed)
Subjective:   Patient ID: Katelyn Estes, female   DOB: 88 y.o.   MRN: [id number]   HPI Patient presents stating she cannot take care of her toenails and they are getting thick and dystrophic and making it hard for her to wear shoe gear comfortably.  Patient also comes concerned about lymphedema and does not smoke is not active   Review of Systems  All other systems reviewed and are negative.      Objective:  Physical Exam Vitals and nursing note reviewed.  Constitutional:      Appearance: She is well-developed.  Pulmonary:     Effort: Pulmonary effort is normal.  Musculoskeletal:        General: Normal range of motion.  Skin:    General: Skin is warm.  Neurological:     Mental Status: She is alert.    Neurovascular status was found to be intact muscle strength is adequate range of motion of the subtalar midtarsal joint within normal limits.  Patient has nail disease 1-5 both feet with thick yellow brittle type nailbeds that do get tender and she cannot cut herself and has good digital perfusion well oriented x3     Assessment:  Chronic mycotic nail infections that are painful 1-5 both feet along with lymphedema bilateral     Plan:  H&P do not recommend any treatment that I can see for lymphedema except for elevation and compression stockings as needed and debrided nailbeds 1-5 both feet no iatrogenic bleeding reappoint as needed

## 2021-09-10 ENCOUNTER — Telehealth: Payer: Self-pay | Admitting: Cardiovascular Disease

## 2021-09-10 NOTE — Telephone Encounter (Signed)
Patient is having surgery on her face for cancer. She wants to know if she needs to stop taking Plavix.

## 2021-09-10 NOTE — Telephone Encounter (Signed)
Called patient back. Patient was wondering if she needed to hold her plavix for her surgery for removal of a cancer spot on her face. Informed patient that she should contact her surgeon and see what they recommend. Informed her that if they need to have her hold her plavix, then she can get back with [place] to fine out how long it can be held. Patient agreed to plan and will call back if needed.

## 2021-10-15 ENCOUNTER — Encounter: Payer: Self-pay | Admitting: Family Medicine

## 2021-10-15 ENCOUNTER — Ambulatory Visit (INDEPENDENT_AMBULATORY_CARE_PROVIDER_SITE_OTHER): Payer: Medicare Other | Admitting: Family Medicine

## 2021-10-15 VITALS — BP 132/88 | HR 82 | Temp 98.0°F | Ht 62.0 in | Wt 185.8 lb

## 2021-10-15 DIAGNOSIS — E538 Deficiency of other specified B group vitamins: Secondary | ICD-10-CM

## 2021-10-15 DIAGNOSIS — E785 Hyperlipidemia, unspecified: Secondary | ICD-10-CM | POA: Diagnosis not present

## 2021-10-15 DIAGNOSIS — E559 Vitamin D deficiency, unspecified: Secondary | ICD-10-CM | POA: Diagnosis not present

## 2021-10-15 DIAGNOSIS — I10 Essential (primary) hypertension: Secondary | ICD-10-CM | POA: Diagnosis not present

## 2021-10-15 DIAGNOSIS — R739 Hyperglycemia, unspecified: Secondary | ICD-10-CM | POA: Diagnosis not present

## 2021-10-15 DIAGNOSIS — I25119 Atherosclerotic heart disease of native coronary artery with unspecified angina pectoris: Secondary | ICD-10-CM

## 2021-10-15 DIAGNOSIS — M1990 Unspecified osteoarthritis, unspecified site: Secondary | ICD-10-CM

## 2021-10-15 LAB — COMPREHENSIVE METABOLIC PANEL
ALT: 14 U/L (ref 0–35)
AST: 16 U/L (ref 0–37)
Albumin: 4.1 g/dL (ref 3.5–5.2)
Alkaline Phosphatase: 53 U/L (ref 39–117)
BUN: 16 mg/dL (ref 6–23)
CO2: 29 mEq/L (ref 19–32)
Calcium: 9.6 mg/dL (ref 8.4–10.5)
Chloride: 102 mEq/L (ref 96–112)
Creatinine, Ser: 0.63 mg/dL (ref 0.40–1.20)
GFR: 78.89 mL/min (ref >60.00)
Glucose, Bld: 93 mg/dL (ref 70–99)
Potassium: 4 mEq/L (ref 3.5–5.1)
Sodium: 140 mEq/L (ref 135–145)
Total Bilirubin: 0.4 mg/dL (ref 0.2–1.2)
Total Protein: 6.3 g/dL (ref 6.0–8.3)

## 2021-10-15 LAB — CBC
HCT: 40.7 % (ref 36.0–46.0)
Hemoglobin: 13.6 g/dL (ref 12.0–15.0)
MCHC: 33.5 g/dL (ref 30.0–36.0)
MCV: 96.1 fl (ref 78.0–100.0)
Platelets: 263 10*3/uL (ref 150.0–400.0)
RBC: 4.23 Mil/uL (ref 3.87–5.11)
RDW: 13.8 % (ref 11.5–15.5)
WBC: 5 10*3/uL (ref 4.0–10.5)

## 2021-10-15 LAB — LIPID PANEL
Cholesterol: 202 mg/dL — ABNORMAL HIGH (ref 0–200)
HDL: 79.5 mg/dL (ref >39.00)
LDL Cholesterol: 103 mg/dL — ABNORMAL HIGH (ref 0–99)
NonHDL: 122.16
Total CHOL/HDL Ratio: 3
Triglycerides: 98 mg/dL (ref 0.0–149.0)
VLDL: 19.6 mg/dL (ref 0.0–40.0)

## 2021-10-15 LAB — HEMOGLOBIN A1C: Hgb A1c MFr Bld: 5.9 % (ref 4.6–6.5)

## 2021-10-15 LAB — VITAMIN B12: Vitamin B-12: 899 pg/mL (ref 211–911)

## 2021-10-15 LAB — TSH: TSH: 3.08 u[IU]/mL (ref 0.35–5.50)

## 2021-10-15 NOTE — Assessment & Plan Note (Signed)
Still having a significant amount of pain in her shoulders and foot. She can continue OTC tylenol and voltaren as needed. Will refer to physical therapy.

## 2021-10-15 NOTE — Patient Instructions (Signed)
It was very nice to see you today!  I will refer you to see a physical therapist.  We will check blood work today.  I will see back in 6 months.  You can come back in 1 to 2 days ahead of time for blood work.  Please come back to see me sooner if needed.  Take care, Dr Jimmey Ralph  PLEASE NOTE:  If you had any lab tests please let us know if you have not heard back within a few days. You may see your results on mychart before we have a chance to review them but we will give you a call once they are reviewed by [place]. If we ordered any referrals today, please let us know if you have not heard from their office within the next week.   Please try these tips to maintain a healthy lifestyle:  Eat at least 3 REAL meals and 1-2 snacks per day.  Aim for no more than 5 hours between eating.  If you eat breakfast, please do so within one hour of getting up.   Each meal should contain half fruits/vegetables, one quarter protein, and one quarter carbs (no bigger than a computer mouse)  Cut down on sweet beverages. This includes juice, soda, and sweet tea.   Drink at least 1 glass of water with each meal and aim for at least 8 glasses per day  Exercise at least 150 minutes every week.

## 2021-10-15 NOTE — Progress Notes (Signed)
   Katelyn Estes is a 88 y.o. female who presents today for an office visit.  Assessment/Plan:  New/Acute Problems: Other Fatigue Likely multifactorial. No red flags. Will check requested labs.  Possibly related to poor sleep quality as well.  Discussed trial of melatonin 5 to 10 mg nightly to see if this helps.  Chronic Problems Addressed Today: Vitamin D deficiency Check Vitamin D.   Osteoarthritis Still having a significant amount of pain in her shoulders and foot. She can continue OTC tylenol and voltaren as needed. Will refer to physical therapy.   Hyperglycemia Check A1c.   Dyslipidemia Check lipids.   Coronary artery disease Continue management per cardiology.   Essential hypertension At goal today on atenolol 25mg  daily.      Subjective:  HPI:  See A/p for status of chronic conditions.         Objective:  Physical Exam: BP 132/88   Pulse 82   Temp 98 F (36.7 C) (Temporal)   Ht 5\' 2"  (1.575 m)   Wt 185 lb 12.8 oz (84.3 kg)   LMP  (LMP Unknown)   SpO2 96%   BMI 33.98 kg/m   Gen: No acute distress, resting comfortably CV: Regular rate and rhythm with 2/6 systolic murmur appreciated Pulm: Normal work of breathing, clear to auscultation bilaterally with no crackles, wheezes, or rhonchi MSK: Pain in bilateral shoulders.  Worse with motion.  Neurovascular intact distally. Neuro: Grossly normal, moves all extremities Psych: Normal affect and thought content      Lyriq Jarchow M. , MD 10/15/2021 8:29 AM

## 2021-10-15 NOTE — Assessment & Plan Note (Signed)
Check A1c. 

## 2021-10-15 NOTE — Assessment & Plan Note (Signed)
At goal today on atenolol 25 mg daily. 

## 2021-10-15 NOTE — Assessment & Plan Note (Signed)
Check lipids 

## 2021-10-15 NOTE — Assessment & Plan Note (Signed)
Continue management per cardiology. 

## 2021-10-15 NOTE — Therapy (Unsigned)
OUTPATIENT PHYSICAL THERAPY SHOULDER*** EVALUATION   Patient Name: Katelyn Estes MRN: [id number] DOB:Nov 07, 1930, 88 y.o., female Today's Date: 10/15/2021    Past Medical History:  Diagnosis Date   Arthritis    Cataracts, bilateral    Congestive heart disease (HCC)    Past Surgical History:  Procedure Laterality Date   BACK SURGERY  2005   REPLACEMENT TOTAL KNEE BILATERAL  2003   TONSILLECTOMY  1939   Patient Active Problem List   Diagnosis Date Noted   Trigeminal neuralgia 04/16/2021   Essential hypertension 01/13/2021   Coronary artery disease 01/13/2021   Dyslipidemia 01/13/2021   Hyperglycemia 01/13/2021   OSA (obstructive sleep apnea) 01/13/2021   Osteoarthritis 01/13/2021   Constipation 01/13/2021   Lymphedema 01/13/2021   Vitamin D deficiency 01/13/2021   Allergic rhinitis 01/13/2021   Insomnia 01/13/2021    PCP: Ardith Dark, MD  REFERRING PROVIDER: Ardith Dark, MD  REFERRING DIAG: M19.90 (ICD-10-CM) - Osteoarthritis, unspecified osteoarthritis type, unspecified site  THERAPY DIAG:  No diagnosis found.  Rationale for Evaluation and Treatment Rehabilitation  ONSET DATE: ***  SUBJECTIVE:                                                                                                                                                                                      SUBJECTIVE STATEMENT: ***  PERTINENT HISTORY: CHD, B TKA 2003, back surgery 2005  PAIN:  Are you having pain? Yes: NPRS scale: ***/10 Pain location: *** Pain description: *** Aggravating factors: *** Relieving factors: ***  PRECAUTIONS: {Therapy precautions:24002}  WEIGHT BEARING RESTRICTIONS {Yes ***/No:24003}  FALLS:  Has patient fallen in last 6 months? {fallsyesno:27318}  LIVING ENVIRONMENT: Lives with: {[facility] lives with:25569::"lives with their family"} Lives in: {Lives in:25570} Stairs: {opstairs:27293} Has following equipment at home: {Assistive  devices:23999}  OCCUPATION: ***  PLOF: {PLOF:24004}  PATIENT GOALS ***  OBJECTIVE:   DIAGNOSTIC FINDINGS:  ***  PATIENT SURVEYS:  {rehab surveys:24030:a}  COGNITION:  Overall cognitive status: {cognition:24006}     SENSATION: {sensation:27233}  POSTURE: ***     LE Measurements Lower Extremity Right 10/15/2021 Left 10/15/2021   A/PROM MMT A/PROM MMT  Hip Flexion      Hip Extension      Hip Abduction      Hip Adduction      Hip Internal rotation      Hip External rotation      Knee Flexion      Knee Extension      Ankle Dorsiflexion      Ankle Plantarflexion      Ankle Inversion      Ankle Eversion       (Blank  rows = not tested)  * pain   SHOULDER SPECIAL TESTS:  Impingement tests: {shoulder impingement test:25231:a}  SLAP lesions: {SLAP lesions:25232}  Instability tests: {shoulder instability test:25233}  Rotator cuff assessment: {rotator cuff assessment:25234}  Biceps assessment: {biceps assessment:25235}  JOINT MOBILITY TESTING:  ***  PALPATION:  ***   TODAY'S TREATMENT:  ***   PATIENT EDUCATION: Education details: *** Person educated: {Person educated:25204} Education method: {Education Method:25205} Education comprehension: {Education Comprehension:25206}   HOME EXERCISE PROGRAM: ***  ASSESSMENT:  CLINICAL IMPRESSION: Patient is a *** y.o. *** who was seen today for physical therapy evaluation and treatment for ***.    OBJECTIVE IMPAIRMENTS {opptimpairments:25111}.   ACTIVITY LIMITATIONS {activitylimitations:27494}  PARTICIPATION LIMITATIONS: {participationrestrictions:25113}  PERSONAL FACTORS {Personal factors:25162} are also affecting patient's functional outcome.   REHAB POTENTIAL: {rehabpotential:25112}  CLINICAL DECISION MAKING: {clinical decision making:25114}  EVALUATION COMPLEXITY: {Evaluation complexity:25115}   GOALS: Goals reviewed with patient?  yes  SHORT TERM GOALS:  Patient will be independent in self  management strategies to improve quality of life and functional outcomes. Baseline: new program Target date: {follow up:25551} Goal status: INITIAL  2.  Patient will report at least 50% improvement in overall symptoms and/or function to demonstrate improved functional mobility Baseline: 0% Target date: {follow up:25551} Goal status: INITIAL  3.  *** Baseline: *** Target date: {follow up:25551} Goal status: INITIAL  4.  *** Baseline: *** Target date: {follow up:25551} Goal status: INITIAL     LONG TERM GOALS:  Patient will report at least 75% improvement in overall symptoms and/or function to demonstrate improved functional mobility Baseline: 0% Target date: {follow up:25551} Goal status: INITIAL  2.  *** Baseline: *** Target date: {follow up:25551} Goal status: INITIAL  3.  *** Baseline: *** Target date: {follow up:25551} Goal status: INITIAL  4.  *** Baseline: *** Target date: {follow up:25551} Goal status: INITIAL     PLAN: PT FREQUENCY: {rehab frequency:25116}  PT DURATION: {rehab duration:25117}  PLANNED INTERVENTIONS: Therapeutic exercises, Therapeutic activity, Neuromuscular re-education, Balance training, Gait training, Patient/Family education, Joint mobilization, Stair training, Aquatic Therapy, Dry Needling, Electrical stimulation, Spinal mobilization, Cryotherapy, Moist heat, Ionotophoresis 4mg /ml Dexamethasone, and Manual therapy  PLAN FOR NEXT SESSION: ***   1:27 PM, 10/15/21 12/16/21, DPT Physical Therapy with [facility]

## 2021-10-15 NOTE — Assessment & Plan Note (Signed)
Check Vitamin D.  

## 2021-10-16 ENCOUNTER — Other Ambulatory Visit: Payer: Self-pay | Admitting: *Deleted

## 2021-10-16 ENCOUNTER — Ambulatory Visit (INDEPENDENT_AMBULATORY_CARE_PROVIDER_SITE_OTHER): Payer: Medicare Other | Admitting: Physical Therapy

## 2021-10-16 ENCOUNTER — Telehealth: Payer: Self-pay | Admitting: Family Medicine

## 2021-10-16 ENCOUNTER — Encounter: Payer: Self-pay | Admitting: Physical Therapy

## 2021-10-16 DIAGNOSIS — M25511 Pain in right shoulder: Secondary | ICD-10-CM | POA: Diagnosis not present

## 2021-10-16 DIAGNOSIS — G8929 Other chronic pain: Secondary | ICD-10-CM

## 2021-10-16 DIAGNOSIS — M6281 Muscle weakness (generalized): Secondary | ICD-10-CM

## 2021-10-16 DIAGNOSIS — M25512 Pain in left shoulder: Secondary | ICD-10-CM | POA: Diagnosis not present

## 2021-10-16 NOTE — Telephone Encounter (Signed)
Patient was going over AVS and noticed she does not take atenolol (TENORMIN) 25 MG tablet  Asking if she can please remove medication off her list.

## 2021-10-16 NOTE — Telephone Encounter (Signed)
Rx removed form medication list

## 2021-10-16 NOTE — Progress Notes (Signed)
Please inform patient of the following:  Labs are all stable. We can recheck again in 6 months.  Do not need to make any changes to treatment plan at this time.

## 2021-10-22 ENCOUNTER — Encounter: Payer: Medicare Other | Admitting: Physical Therapy

## 2021-10-27 ENCOUNTER — Ambulatory Visit (INDEPENDENT_AMBULATORY_CARE_PROVIDER_SITE_OTHER): Payer: Medicare Other | Admitting: Physical Therapy

## 2021-10-27 ENCOUNTER — Encounter: Payer: Self-pay | Admitting: Physical Therapy

## 2021-10-27 DIAGNOSIS — M25512 Pain in left shoulder: Secondary | ICD-10-CM

## 2021-10-27 DIAGNOSIS — M25511 Pain in right shoulder: Secondary | ICD-10-CM

## 2021-10-27 DIAGNOSIS — M6281 Muscle weakness (generalized): Secondary | ICD-10-CM | POA: Diagnosis not present

## 2021-10-27 DIAGNOSIS — G8929 Other chronic pain: Secondary | ICD-10-CM

## 2021-10-27 NOTE — Therapy (Signed)
OUTPATIENT PHYSICAL THERAPY TREATMENT NOTE   Patient Name: Katelyn Estes MRN: [id number] DOB:1930/04/17, 88 y.o., female Today's Date: 10/27/2021   END OF SESSION:   PT End of Session - 10/27/21 1030     Visit Number 2    Number of Visits 16    Date for PT Re-Evaluation 12/11/21    Authorization Type medicare/bcbs    Progress Note Due on Visit 10    PT Start Time 1031   late to apt   PT Stop Time 1055    PT Time Calculation (min) 24 min    Activity Tolerance No increased pain             Past Medical History:  Diagnosis Date   Arthritis    Cataracts, bilateral    Congestive heart disease (HCC)    Past Surgical History:  Procedure Laterality Date   BACK SURGERY  2005   REPLACEMENT TOTAL KNEE BILATERAL  2003   TONSILLECTOMY  1939   Patient Active Problem List   Diagnosis Date Noted   Trigeminal neuralgia 04/16/2021   Essential hypertension 01/13/2021   Coronary artery disease 01/13/2021   Dyslipidemia 01/13/2021   Hyperglycemia 01/13/2021   OSA (obstructive sleep apnea) 01/13/2021   Osteoarthritis 01/13/2021   Constipation 01/13/2021   Lymphedema 01/13/2021   Vitamin D deficiency 01/13/2021   Allergic rhinitis 01/13/2021   Insomnia 01/13/2021   PCP: Ardith Dark, MD   REFERRING PROVIDER: Ardith Dark, MD   REFERRING DIAG: M19.90 (ICD-10-CM) - Osteoarthritis, unspecified osteoarthritis type, unspecified site   THERAPY DIAG:  Chronic left shoulder pain   Chronic right shoulder pain   Muscle weakness (generalized)   Rationale for Evaluation and Treatment Rehabilitation   ONSET DATE: for years   SUBJECTIVE:                                                                                                                                                                                       SUBJECTIVE STATEMENT: 10/27/2021 Stats that she feels ok, [place]. States she got lost on the way here.  States she tried to do her exercises when she thought about it. States it always seems to hurt when she goes to bed.   Eval: States that she has pain in her shoulders and upper arms and occasionally in her heel. States that she has arthritis. States that she had injections in her arms and that helped a short while. States that she recently moved down here and when she was in [place] she was in PT before for her lower legs. Voltaren helps  a little bit. Shoulder pain left worse than right. States that she has started taking better motion exercise.   PERTINENT HISTORY: CHD, B TKA 2003, back surgery 2005, lymphedema. Chronic low back pain.   PAIN:  Are you having pain? Yes: NPRS scale: 6/10 Pain location: B shoulders Pain description: discomforting, sharp at times, Aggravating factors: laying gon it, reaching  Relieving factors: rest, Voltaren   PRECAUTIONS: None   WEIGHT BEARING RESTRICTIONS No   FALLS:  Has patient fallen in last 6 months? 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, playing cards   PLOF: Independent   PATIENT GOALS to have less pain,    OBJECTIVE:    COGNITION:           Overall cognitive status: Within functional limits for tasks assessed                                  SENSATION: WFL   POSTURE: Slumped posture, forward head, increased thoracic kyphosis                    UE Measurements       Upper Extremity Right 10/16/2021 Left 10/16/2021    A/PROM MMT A/PROM MMT  Shoulder Flexion 140   130    Shoulder Extension          Shoulder Abduction [facility]   [facility]    Shoulder Adduction          Shoulder Internal Rotation          Shoulder External Rotation 45   30*    Elbow Flexion          Elbow Extension          Wrist Flexion          Wrist Extension          Wrist Supination           Wrist Pronation          Wrist Ulnar Deviation          Wrist Radial Deviation          Grip Strength NA   NA                          (Blank rows = not tested)                       * pain            Cervical  A/PROM:    10/16/2021      Flexion WFL     Extension  10     R ROT 60      L ROT  50     R SB      L SB                            * Pain              (Blank rows = not tested)         JOINT MOBILITY TESTING:  Hypomobility in B shoulders             PALPATION:  Tenderness to palpation along B biceps and deltoid L>R             TODAY'S TREATMENT:  10/27/2021 Therapeutic Exercise:            Aerobic: Supine: bicep curls x30 B AAROM shoulder flexion cane - not tolerated well Prone:            Seated:shoulder rolls x15, neck circles x15, shoulder ER B x20            Standing: Neuromuscular Re-education: Manual Therapy: gentle STM to R upper arm - tolerated mildly well, vibration with lots of towels for support/cushion - tolerated better, PROM of left shoulder - not tolerated well Therapeutic Activity: Self Care: Trigger Point Dry Needling:  Modalities:        PATIENT EDUCATION:  Education details HEP, on anaotmy Person educated: Patient Education method: Explanation, Demonstration, and Handouts Education comprehension: verbalized understanding       HOME EXERCISE PROGRAM: [id number]   ASSESSMENT:   CLINICAL IMPRESSION: 10/27/2021 Session limited secondary to late arrival. Focused on pain management, poor tolerance to al interventions. Educated patient on difference between muscle and joint pain and instructed patient to focus on light massage to left biceps where pain is. Muscle with multiple trigger points and tenderness throughout. Will continue with current POC as tolerated.   Eval: Patient is a 88 y.o. female who was seen today for physical therapy evaluation and treatment for B shoulder pain. Patient has had pain for years and reports things have  worsened since COVID. Patient presents with pain, ROM and functional limitations that are affecting overall quality of life. Educated patient in POC and patient would greatly benefit from skilled PT to improve overall function and pain levels in shoulders.      OBJECTIVE IMPAIRMENTS decreased activity tolerance, decreased balance, decreased ROM, decreased strength, impaired UE functional use, postural dysfunction, and pain.    ACTIVITY LIMITATIONS carrying, lifting, bathing, and reach over head   PARTICIPATION LIMITATIONS: meal prep, cleaning, and laundry   PERSONAL FACTORS Age, Fitness, and 1-2 comorbidities: chronic shoulder pain, chronic back pain, CHD  are also affecting patient's functional outcome.    REHAB POTENTIAL: Good   CLINICAL DECISION MAKING: Stable/uncomplicated   EVALUATION COMPLEXITY: Low     GOALS: Goals reviewed with patient?  yes   SHORT TERM GOALS:   Patient will be independent in self management strategies to improve quality of life and functional outcomes. Baseline: new program Target date: 11/13/2021 Goal status: INITIAL   2.  Patient will report at least 50% improvement in overall symptoms and/or function to demonstrate improved functional mobility Baseline: 0% Target date: 11/13/2021 Goal status: INITIAL   3.  Patient will be able to perform at least 45 degrees of left shoulder ROM Baseline: 30 Target date: 11/13/2021 Goal status: INITIAL           LONG TERM GOALS:   Patient will report at least 75% improvement in overall symptoms and/or function to demonstrate improved functional mobility Baseline: 0% Target date: 12/11/2021 Goal status: INITIAL   2.  Patient will report performing daily mobility routine to improve overall motion and tolerance to basic motions. Baseline: none currently Target date: 12/11/2021 Goal status: INITIAL   3.  Patient will be able to raise arms up overhead to at least 140 degrees of flexion bilaterally to improve  overhead reaching Baseline: see above Target date: 12/11/2021 Goal status: INITIAL         PLAN: PT FREQUENCY: 2x/week  PT DURATION: 8 weeks   PLANNED INTERVENTIONS: Therapeutic exercises, Therapeutic activity, Neuromuscular re-education, Balance training, Gait training, Patient/Family education, Joint mobilization, Stair training, Aquatic Therapy, Dry Needling, Electrical stimulation, Spinal mobilization, Cryotherapy, Moist heat, Ionotophoresis 4mg /ml Dexamethasone, and Manual therapy   PLAN FOR NEXT SESSION: shoulder and neck ROM - seated exercises, supine      11:02 AM, 10/27/21 10/29/21, DPT Physical Therapy with [facility]

## 2021-10-29 ENCOUNTER — Encounter: Payer: Self-pay | Admitting: Physical Therapy

## 2021-10-29 ENCOUNTER — Ambulatory Visit (INDEPENDENT_AMBULATORY_CARE_PROVIDER_SITE_OTHER): Payer: Medicare Other | Admitting: Physical Therapy

## 2021-10-29 DIAGNOSIS — M25511 Pain in right shoulder: Secondary | ICD-10-CM | POA: Diagnosis not present

## 2021-10-29 DIAGNOSIS — M25512 Pain in left shoulder: Secondary | ICD-10-CM | POA: Diagnosis not present

## 2021-10-29 DIAGNOSIS — M6281 Muscle weakness (generalized): Secondary | ICD-10-CM | POA: Diagnosis not present

## 2021-10-29 DIAGNOSIS — G8929 Other chronic pain: Secondary | ICD-10-CM | POA: Diagnosis not present

## 2021-10-29 NOTE — Therapy (Addendum)
OUTPATIENT PHYSICAL THERAPY TREATMENT NOTE PHYSICAL THERAPY DISCHARGE SUMMARY  Visits from Start of Care: 3  Current functional level related to goals / functional outcomes: Unable to assess due to unplanned discharge    Remaining deficits: Unable to assess due to unplanned discharge    Education / Equipment:  Unable to assess due to unplanned discharge  Patient agrees to discharge. Patient goals were not met. Patient is being discharged due to not returning since the last visit. 12:43 PM, 01/26/22 Jerene Pitch, DPT Physical Therapy with [place]    Patient Name: Katelyn Estes MRN: [id number] DOB:[birth year], 88 y.o., female Today's Date: 10/29/2021   END OF SESSION:   PT End of Session - 10/29/21 1020     Visit Number 3    Number of Visits 16    Date for PT Re-Evaluation 12/11/21    Authorization Type medicare/bcbs    Progress Note Due on Visit 10    PT Start Time 1021    PT Stop Time 1101    PT Time Calculation (min) 40 min    Activity Tolerance No increased pain             Past Medical History:  Diagnosis Date   Arthritis    Cataracts, bilateral    Congestive heart disease ([place])    Past Surgical History:  Procedure Laterality Date   BACK SURGERY  2005   REPLACEMENT TOTAL KNEE BILATERAL  2003   TONSILLECTOMY  1939   Patient Active Problem List   Diagnosis Date Noted   Trigeminal neuralgia 04/16/2021   Essential hypertension 01/13/2021   Coronary artery disease 01/13/2021   Dyslipidemia 01/13/2021   Hyperglycemia 01/13/2021   OSA (obstructive sleep apnea) 01/13/2021   Osteoarthritis 01/13/2021   Constipation 01/13/2021   Lymphedema 01/13/2021   Vitamin D deficiency 01/13/2021   Allergic rhinitis 01/13/2021   Insomnia 01/13/2021   PCP: Vivi Barrack, MD   REFERRING PROVIDER: Vivi Barrack, MD   REFERRING DIAG: M19.90 (ICD-10-CM) - Osteoarthritis, unspecified osteoarthritis type, unspecified site   THERAPY DIAG:  Chronic left  shoulder pain   Chronic right shoulder pain   Muscle weakness (generalized)   Rationale for Evaluation and Treatment Rehabilitation   ONSET DATE: for years   SUBJECTIVE:                                                                                                                                                                                       SUBJECTIVE STATEMENT: 10/29/2021 States her right arm and hand are really bothering her and really started bothering her last night.   Eval: States that she has pain in her shoulders  and upper arms and occasionally in her heel. States that she has arthritis. States that she had injections in her arms and that helped a short while. States that she recently moved down here and when she was in [place] she was in PT before for her lower legs. Voltaren helps a little bit. Shoulder pain left worse than right. States that she has started taking better motion exercise.   PERTINENT HISTORY: CHD, B TKA 2003, back surgery 2005, lymphedema. Chronic low back pain.   PAIN:  Are you having pain? Yes: NPRS scale: 6/10 Pain location: B shoulders Pain description: discomforting, sharp at times, Aggravating factors: laying gon it, reaching  Relieving factors: rest, Voltaren   PRECAUTIONS: None   WEIGHT BEARING RESTRICTIONS No   FALLS:  Has patient fallen in last 6 months? 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, playing cards   PLOF: Independent   PATIENT GOALS to have less pain,    OBJECTIVE:    COGNITION:           Overall cognitive status: Within functional limits for tasks assessed                                  SENSATION: WFL   POSTURE: Slumped posture, forward head, increased thoracic kyphosis                    UE Measurements       Upper Extremity  Right 10/16/2021 Left 10/16/2021    A/PROM MMT A/PROM MMT  Shoulder Flexion 140   130    Shoulder Extension          Shoulder Abduction [facility]   [facility]    Shoulder Adduction          Shoulder Internal Rotation          Shoulder External Rotation 45   30*    Elbow Flexion          Elbow Extension          Wrist Flexion          Wrist Extension          Wrist Supination          Wrist Pronation          Wrist Ulnar Deviation          Wrist Radial Deviation          Grip Strength NA   NA                          (Blank rows = not tested)                       * pain            Cervical  A/PROM:    10/16/2021      Flexion WFL     Extension  10     R ROT 60      L ROT  50     R SB      L SB                            *  Pain              (Blank rows = not tested)         JOINT MOBILITY TESTING:  Hypomobility in B shoulders             PALPATION:  Tenderness to palpation along B biceps and deltoid L>R             TODAY'S TREATMENT:  10/29/2021 Therapeutic Exercise:            Aerobic: Supine: bicep curls x30 B AAROM shoulder flexion cane - not tolerated well Prone:            Seated:hand/wrist and finger motion with arm elevated 5x10 R            Standing: Neuromuscular Re-education: Manual Therapy: gentle STM and IASTM to right UE and hand  Therapeutic Activity: Self Care: Trigger Point Dry Needling:  Modalities:        PATIENT EDUCATION:  Education details HEP, on anatomy, on lymphedema causes, on uses of pump/compression garments, on elevation and benefits, on joint pain versus muscle pain, difference between ice and heat for swelling Person educated: Patient Education method: Explanation, Demonstration, and Handouts Education comprehension: verbalized understanding       HOME EXERCISE PROGRAM: [id number]   ASSESSMENT:   CLINICAL IMPRESSION: 10/29/2021 Session focused on pain management strategies. Mild pain relief noted with manual and PROM interventions. Reduced  swelling in right hand following elevated AROM of wrist/hand, but continued pain. Discussed trying topical ointments she has at home to help with pain. Will f/u with MD if pain continues with minimal improvement.  Eval: Patient is a 88 y.o. female who was seen today for physical therapy evaluation and treatment for B shoulder pain. Patient has had pain for years and reports things have worsened since COVID. Patient presents with pain, ROM and functional limitations that are affecting overall quality of life. Educated patient in [place] and patient would greatly benefit from skilled PT to improve overall function and pain levels in shoulders.      OBJECTIVE IMPAIRMENTS decreased activity tolerance, decreased balance, decreased ROM, decreased strength, impaired UE functional use, postural dysfunction, and pain.    ACTIVITY LIMITATIONS carrying, lifting, bathing, and reach over head   PARTICIPATION LIMITATIONS: meal prep, cleaning, and laundry   PERSONAL FACTORS Age, Fitness, and 1-2 comorbidities: chronic shoulder pain, chronic back pain, CHD  are also affecting patient's functional outcome.    REHAB POTENTIAL: Good   CLINICAL DECISION MAKING: Stable/uncomplicated   EVALUATION COMPLEXITY: Low     GOALS: Goals reviewed with patient?  yes   SHORT TERM GOALS:   Patient will be independent in self management strategies to improve quality of life and functional outcomes. Baseline: new program Target date: 11/13/2021 Goal status: INITIAL   2.  Patient will report at least 50% improvement in overall symptoms and/or function to demonstrate improved functional mobility Baseline: 0% Target date: 11/13/2021 Goal status: INITIAL   3.  Patient will be able to perform at least 45 degrees of left shoulder ROM Baseline: 30 Target date: 11/13/2021 Goal status: INITIAL           LONG TERM GOALS:   Patient will report at least 75% improvement in overall symptoms and/or function to demonstrate improved  functional mobility Baseline: 0% Target date: 12/11/2021 Goal status: INITIAL   2.  Patient will report performing daily mobility routine to improve overall motion and tolerance to basic motions. Baseline: none currently Target date:  12/11/2021 Goal status: INITIAL   3.  Patient will be able to raise arms up overhead to at least 140 degrees of flexion bilaterally to improve overhead reaching Baseline: see above Target date: 12/11/2021 Goal status: INITIAL         PLAN: PT FREQUENCY: 2x/week   PT DURATION: 8 weeks   PLANNED INTERVENTIONS: Therapeutic exercises, Therapeutic activity, Neuromuscular re-education, Balance training, Gait training, Patient/Family education, Joint mobilization, Stair training, Aquatic Therapy, Dry Needling, Electrical stimulation, Spinal mobilization, Cryotherapy, Moist heat, Ionotophoresis 90m/ml Dexamethasone, and Manual therapy   PLAN FOR NEXT SESSION: shoulder and neck ROM - seated exercises, supine      11:06 AM, 10/29/21 MJerene Pitch DPT Physical Therapy with [facility]

## 2021-11-05 ENCOUNTER — Encounter: Payer: Medicare Other | Admitting: Physical Therapy

## 2021-11-10 ENCOUNTER — Encounter: Payer: Medicare Other | Admitting: Physical Therapy

## 2021-11-12 ENCOUNTER — Encounter: Payer: Medicare Other | Admitting: Physical Therapy

## 2021-11-17 ENCOUNTER — Encounter: Payer: Medicare Other | Admitting: Physical Therapy

## 2021-11-19 ENCOUNTER — Encounter: Payer: Medicare Other | Admitting: Physical Therapy

## 2021-11-24 ENCOUNTER — Encounter: Payer: Medicare Other | Admitting: Physical Therapy

## 2021-11-26 ENCOUNTER — Encounter: Payer: Medicare Other | Admitting: Physical Therapy

## 2021-11-26 NOTE — Progress Notes (Signed)
CARDIOLOGY CONSULT NOTE       Patient ID: Katelyn Estes MRN: [id number] DOB/AGE: July 21, 1930 88 y.o.  Admit date: (Not on file) Referring Physician: Jimmey Ralph Primary Physician: Ardith Dark, MD Primary Cardiologist: Eden Emms Reason for Consultation: CAD/Lymphedema    HPI:  88 y.o. referred by Dr Jimmey Ralph for CAD. First seen by me 02/18/21 She has chronic lymphedema using pump since 2016 and has seen Dr Lajoyce Corners , wound center and oncology lymphedema center Reviewed notes from [facility] History of stent to mid LAD.  Last cath 2017 with patent stent and moderate dx in proximal LAD and proximal RCA. FFR negative stent patent March 2021 lexiscan myovue normal Echo's have shown normal EF and trace MR. Chronic LE edema, dyspnea and atypical sharp chest pains better with Ranexa Tends to be bradycardic previous monitor with no AV block and self limited SVT. She is overweight and has OSA with recent use of CPAP  No angina Some trigeminal neuralgia and Insomnia   Use to work for [employer] in [employer] Widowed over 45 years Has 4 daughters 3 in [place] And moved to town home here. Having trouble getting socially integrated and finding People to play cards with   Lethargy with coreg now on atenolol   She does not like her new lymph machine Too cumbersome She lives at the [place] a few houses down from my father in law  Going to PT for her multiple shoulder, leg, back pains Voltaren helps    ROS All other systems reviewed and negative except as noted above  Past Medical History:  Diagnosis Date   Arthritis    Cataracts, bilateral    Congestive heart disease (HCC)     No family history on file.  Social History   Socioeconomic History   Marital status: Widowed    Spouse name: Not on file   Number of children: Not on file   Years of education: Not on file   Highest education level: Not on file  Occupational History   Not on file  Tobacco Use   Smoking status: Never   Smokeless tobacco: Never   Substance and Sexual Activity   Alcohol use: Yes    Comment: occ   Drug use: Never   Sexual activity: Not on file  Other Topics Concern   Not on file  Social History Narrative   Not on file   Social Determinants of Health   Financial Resource Strain: Not on file  Food Insecurity: Not on file  Transportation Needs: Not on file  Physical Activity: Not on file  Stress: Not on file  Social Connections: Not on file  Intimate Partner Violence: Not on file    Past Surgical History:  Procedure Laterality Date   BACK SURGERY  2005   REPLACEMENT TOTAL KNEE BILATERAL  2003   TONSILLECTOMY  1939      Current Outpatient Medications:    ammonium lactate (LAC-HYDRIN) 12 % lotion, Apply topically daily., Disp: , Rfl:    clopidogrel (PLAVIX) 75 MG tablet, Take 75 mg by mouth daily., Disp: , Rfl:    Cyanocobalamin (VITAMIN B 12 PO), Take 1 tablet by mouth daily., Disp: , Rfl:    nitroGLYCERIN (NITROSTAT) 0.4 MG SL tablet, Place 1 tablet (0.4 mg total) under the tongue every 5 (five) minutes as needed for chest pain., Disp: 25 tablet, Rfl: 3   VITAMIN D, CHOLECALCIFEROL, PO, Take 1 tablet by mouth daily., Disp: , Rfl:     Physical Exam: Blood pressure  128/72, pulse 88, height 5\' 2"  (1.575 m), weight 186 lb (84.4 kg), SpO2 96 %.    Affect appropriate Overweight elderly female  HEENT: normal Neck supple with no adenopathy JVP normal no bruits no thyromegaly Lungs clear with no wheezing and good diaphragmatic motion Heart:  S1/S2 no murmur, no rub, gallop or click PMI normal Abdomen: benighn, BS positve, no tenderness, no AAA no bruit.  No HSM or HJR Distal pulses intact with no bruits Bilateral lymphedema prominent over feet Post bilateral TKR;s  Neuro non-focal Skin warm and dry No muscular weakness   Labs:   Lab Results  Component Value Date   WBC 5.0 10/15/2021   HGB 13.6 10/15/2021   HCT 40.7 10/15/2021   MCV 96.1 10/15/2021   PLT 263.0 10/15/2021   No results for  input(s): "NA", "K", "CL", "CO2", "BUN", "CREATININE", "CALCIUM", "PROT", "BILITOT", "ALKPHOS", "ALT", "AST", "GLUCOSE" in the last 168 hours.  Invalid input(s): "LABALBU" No results found for: "CKTOTAL", "CKMB", "CKMBINDEX", "TROPONINI"  Lab Results  Component Value Date   CHOL 202 (H) 10/15/2021   CHOL 185 01/13/2021   Lab Results  Component Value Date   HDL 79.50 10/15/2021   HDL 76.80 01/13/2021   Lab Results  Component Value Date   LDLCALC 103 (H) 10/15/2021   LDLCALC 88 01/13/2021   Lab Results  Component Value Date   TRIG 98.0 10/15/2021   TRIG 99.0 01/13/2021   Lab Results  Component Value Date   CHOLHDL 3 10/15/2021   CHOLHDL 2 01/13/2021   No results found for: "LDLDIRECT"    Radiology: No results found.  EKG: SR rate 85 LAD no change 12/01/2021    ASSESSMENT AND PLAN:   CAD: stent to mid LAD with non ischemic myovue March 2021 Historically on plavix monotherapy continue beta blocker  Lymphedema:  Not on diuretic sees lymphedema clinic has pump will f/u with wound center Indicates getting a new machine   OSA:  discussed weight loss and CPAP Bradycardia:  history of Tachycardic today see above regarding low dose atenolol  HLD:  LDL 88  f/u primary defers statin given age   F/U in a year    Signed: April 2021 12/01/2021, 8:32 AM

## 2021-12-01 ENCOUNTER — Ambulatory Visit (INDEPENDENT_AMBULATORY_CARE_PROVIDER_SITE_OTHER): Payer: Medicare Other | Admitting: Podiatry

## 2021-12-01 ENCOUNTER — Encounter: Payer: Self-pay | Admitting: Podiatry

## 2021-12-01 ENCOUNTER — Ambulatory Visit (INDEPENDENT_AMBULATORY_CARE_PROVIDER_SITE_OTHER): Payer: Medicare Other | Admitting: Cardiovascular Disease

## 2021-12-01 VITALS — BP 128/72 | HR 88 | Ht 62.0 in | Wt 186.0 lb

## 2021-12-01 DIAGNOSIS — R319 Hematuria, unspecified: Secondary | ICD-10-CM | POA: Insufficient documentation

## 2021-12-01 DIAGNOSIS — I890 Lymphedema, not elsewhere classified: Secondary | ICD-10-CM | POA: Diagnosis not present

## 2021-12-01 DIAGNOSIS — N281 Cyst of kidney, acquired: Secondary | ICD-10-CM | POA: Insufficient documentation

## 2021-12-01 DIAGNOSIS — M79674 Pain in right toe(s): Secondary | ICD-10-CM

## 2021-12-01 DIAGNOSIS — I25119 Atherosclerotic heart disease of native coronary artery with unspecified angina pectoris: Secondary | ICD-10-CM

## 2021-12-01 DIAGNOSIS — I10 Essential (primary) hypertension: Secondary | ICD-10-CM

## 2021-12-01 DIAGNOSIS — M79675 Pain in left toe(s): Secondary | ICD-10-CM

## 2021-12-01 DIAGNOSIS — B351 Tinea unguium: Secondary | ICD-10-CM

## 2021-12-01 NOTE — Patient Instructions (Signed)

## 2021-12-02 ENCOUNTER — Encounter: Payer: Medicare Other | Admitting: Physical Therapy

## 2021-12-05 ENCOUNTER — Telehealth: Payer: Self-pay | Admitting: Podiatry

## 2021-12-05 NOTE — Telephone Encounter (Signed)
Left a detailed voicemail for the patient with instructions

## 2021-12-05 NOTE — Telephone Encounter (Signed)
Pt states that Dr. Eloy End recommended to use something on her toe nail for fungus but she doesn't remember what it was.  Please advise

## 2021-12-07 NOTE — Progress Notes (Signed)
  Subjective:  Patient ID: Katelyn Estes, female    DOB: 1930-10-01,  MRN: [id number]  Katelyn Estes presents to clinic today for painful elongated mycotic toenails 1-5 bilaterally which are tender when wearing enclosed shoe gear. Pain is relieved with periodic professional debridement.  PCP is Ardith Dark, MD , and last visit was October 15, 2021.  No Known Allergies  Review of Systems: Negative except as noted in the HPI.  Objective: No changes noted in today's physical examination.  Vascular Examination: Palpable pedal pulses b/l LE. Digital hair present b/l. No pedal edema b/l. Skin temperature gradient WNL b/l. No varicosities b/l. No edema noted b/l LE.Marland Kitchen  Dermatological Examination: Pedal skin with normal turgor, texture and tone b/l. No open wounds. No interdigital macerations b/l. Toenails 1-5 b/l thickened, discolored, dystrophic with subungual debris. There is pain on palpation to dorsal aspect of nailplates.  Neurological Examination: Protective sensation intact with 10 gram monofilament b/l LE. Vibratory sensation intact b/l LE.   Musculoskeletal Examination: Muscle strength 5/5 to all LE muscle groups b/l. No pain, crepitus or joint limitation noted with ROM bilateral LE. No gross bony deformities bilaterally.     Latest Ref Rng & Units 10/15/2021    8:28 AM 01/13/2021    2:05 PM  Hemoglobin A1C  Hemoglobin-A1c 4.6 - 6.5 % 5.9  5.9    Assessment/Plan: 1. Pain due to onychomycosis of toenails of both feet   -Patient was evaluated and treated. All patient's and/or POA's questions/concerns answered on today's visit. -Patient to continue soft, supportive shoe gear daily. -Discussed treatment options for onychomycosis. Patient opted for topical OTC therapy. Patient is to apply 1 drop of tea tree oil to affected toenail(s) once daily. -Mycotic toenails 1-5 bilaterally were debrided in length and girth with sterile nail nippers and dremel without incident. -Patient/POA to  call should there be question/concern in the interim.   Return in about 3 months (around 03/03/2022).  Freddie Breech, DPM

## 2021-12-09 ENCOUNTER — Encounter: Payer: Medicare Other | Admitting: Physical Therapy

## 2021-12-26 LAB — GLUCOSE, POCT (MANUAL RESULT ENTRY): POC Glucose: 89 mg/dl (ref 70–99)

## 2021-12-30 ENCOUNTER — Encounter: Payer: Self-pay | Admitting: Family Medicine

## 2021-12-30 ENCOUNTER — Ambulatory Visit (INDEPENDENT_AMBULATORY_CARE_PROVIDER_SITE_OTHER): Payer: Medicare Other | Admitting: Family Medicine

## 2021-12-30 VITALS — BP 119/74 | HR 89 | Temp 98.7°F | Ht 62.0 in | Wt 190.2 lb

## 2021-12-30 DIAGNOSIS — R739 Hyperglycemia, unspecified: Secondary | ICD-10-CM

## 2021-12-30 DIAGNOSIS — R350 Frequency of micturition: Secondary | ICD-10-CM

## 2021-12-30 DIAGNOSIS — I10 Essential (primary) hypertension: Secondary | ICD-10-CM | POA: Diagnosis not present

## 2021-12-30 NOTE — Patient Instructions (Signed)
It was very nice to see you today!  You no longer need pap smears.  We will check a urine sample today.   Take care, Dr [name]  PLEASE NOTE:  If you had any lab tests please let us know if you have not heard back within a few days. You may see your results on mychart before we have a chance to review them but we will give you a call once they are reviewed by [place]. If we ordered any referrals today, please let us know if you have not heard from their office within the next week.   Please try these tips to maintain a healthy lifestyle:  Eat at least 3 REAL meals and 1-2 snacks per day.  Aim for no more than 5 hours between eating.  If you eat breakfast, please do so within one hour of getting up.   Each meal should contain half fruits/vegetables, one quarter protein, and one quarter carbs (no bigger than a computer mouse)  Cut down on sweet beverages. This includes juice, soda, and sweet tea.   Drink at least 1 glass of water with each meal and aim for at least 8 glasses per day  Exercise at least 150 minutes every week.

## 2021-12-30 NOTE — Progress Notes (Signed)
   Katelyn Estes is a 88 y.o. female who presents today for an office visit.  Assessment/Plan:  New/Acute Problems: Ganglion cyst No red flags.  Continue to watch waiting.  If persist will consider referral to sports medicine for further evaluation and management.  Chronic Problems Addressed Today: Hyperglycemia Last A1c 5.9.  We can check again in a few months  Essential hypertension At goal today without any medications.  Preventative Healthcare Had lengthy discussion with patient regarding cervical cancer screening guidelines.  Discussed patient she no longer needs Pap smears.  Her last 2 Pap smears were negative.    Subjective:  HPI:  See A/p for status of chronic conditions.  She has noticed cyst on bilateral hands.  Near her knuckles.  No pain.  No obvious precipitating events.       Objective:  Physical Exam: BP 119/74   Pulse 89   Temp 98.7 F (37.1 C) (Temporal)   Ht 5\' 2"  (1.575 m)   Wt 190 lb 3.2 oz (86.3 kg)   SpO2 98%   BMI 34.79 kg/m   Gen: No acute distress, resting comfortably CV: Regular rate and rhythm with no murmurs appreciated Pulm: Normal work of breathing, clear to auscultation bilaterally with no crackles, wheezes, or rhonchi MSK: Ganglion cyst noted.  Joint hypertrophy noted bilateral hands. Neuro: Grossly normal, moves all extremities Psych: Normal affect and thought content  Time Spent: 30 minutes of total time was spent on the date of the encounter performing the following actions: chart review prior to seeing the patient, obtaining history, performing a medically necessary exam, counseling on the treatment plan including cancer screening guidelines, placing orders, and documenting in our EHR.        Algis Greenhouse. [name], MD 12/30/2021 1:34 PM

## 2021-12-30 NOTE — Assessment & Plan Note (Signed)
At goal today without any medications.

## 2021-12-30 NOTE — Assessment & Plan Note (Signed)
Last A1c 5.9.  We can check again in a few months

## 2021-12-31 LAB — URINALYSIS, ROUTINE W REFLEX MICROSCOPIC
Bilirubin Urine: NEGATIVE
Hgb urine dipstick: NEGATIVE
Ketones, ur: NEGATIVE
Leukocytes,Ua: NEGATIVE
Nitrite: NEGATIVE
Specific Gravity, Urine: 1.015 (ref 1.000–1.030)
Total Protein, Urine: NEGATIVE
Urine Glucose: NEGATIVE
Urobilinogen, UA: 0.2 (ref 0.0–1.0)
pH: 7 (ref 5.0–8.0)

## 2022-01-01 LAB — URINE CULTURE
MICRO NUMBER:: 13937866
Result:: NO GROWTH
SPECIMEN QUALITY:: ADEQUATE

## 2022-01-01 NOTE — Progress Notes (Signed)
Please inform patient of the following:  Urine culture is negative.  Algis Greenhouse. [name], MD 01/01/2022 2:17 PM

## 2022-02-17 ENCOUNTER — Encounter: Payer: Self-pay | Admitting: Podiatry

## 2022-02-17 ENCOUNTER — Ambulatory Visit (INDEPENDENT_AMBULATORY_CARE_PROVIDER_SITE_OTHER): Payer: Medicare Other | Admitting: Podiatry

## 2022-02-17 DIAGNOSIS — M79675 Pain in left toe(s): Secondary | ICD-10-CM | POA: Diagnosis not present

## 2022-02-17 DIAGNOSIS — I890 Lymphedema, not elsewhere classified: Secondary | ICD-10-CM

## 2022-02-17 DIAGNOSIS — B351 Tinea unguium: Secondary | ICD-10-CM | POA: Diagnosis not present

## 2022-02-17 DIAGNOSIS — M79674 Pain in right toe(s): Secondary | ICD-10-CM

## 2022-02-17 NOTE — Progress Notes (Signed)
This patient returns to my office for at risk foot care.  This patient requires this care by a professional since this patient will be at risk due to having lymphedema.  This patient is unable to cut nails herself since the patient cannot reach her nails.These nails are painful walking and wearing shoes.  This patient presents for at risk foot care today.  General Appearance  Alert, conversant and in no acute stress.  Vascular  Dorsalis pedis and posterior tibial  pulses are weakly  palpable  bilaterally.  Capillary return is within normal limits  bilaterally. Temperature is within normal limits  bilaterally.  Neurologic  Senn-Weinstein monofilament wire test within normal limits  bilaterally. Muscle power within normal limits bilaterally.  Nails Thick disfigured discolored nails with subungual debris  from hallux to fifth toes bilaterally. No evidence of bacterial infection or drainage bilaterally.  Orthopedic  No limitations of motion  feet .  No crepitus or effusions noted.  No bony pathology or digital deformities noted.  Skin  normotropic skin with no porokeratosis noted bilaterally.  No signs of infections or ulcers noted.     Onychomycosis  Pain in right toes  Pain in left toes  Consent was obtained for treatment procedures.   Mechanical debridement of nails 1-5  bilaterally performed with a nail nipper.  Filed with dremel without incident.    Return office visit    3 months                  Told patient to return for periodic foot care and evaluation due to potential at risk complications.   Gardiner Barefoot DPM

## 2022-04-03 ENCOUNTER — Other Ambulatory Visit: Payer: Self-pay | Admitting: Family Medicine

## 2022-04-03 DIAGNOSIS — Z1231 Encounter for screening mammogram for malignant neoplasm of breast: Secondary | ICD-10-CM

## 2022-04-10 ENCOUNTER — Other Ambulatory Visit: Payer: Medicare Other

## 2022-04-15 ENCOUNTER — Ambulatory Visit (INDEPENDENT_AMBULATORY_CARE_PROVIDER_SITE_OTHER): Payer: Medicare Other | Admitting: Family Medicine

## 2022-04-15 ENCOUNTER — Encounter: Payer: Self-pay | Admitting: Family Medicine

## 2022-04-15 VITALS — BP 131/81 | HR 86 | Temp 98.2°F | Ht 62.0 in | Wt 190.0 lb

## 2022-04-15 DIAGNOSIS — E559 Vitamin D deficiency, unspecified: Secondary | ICD-10-CM | POA: Diagnosis not present

## 2022-04-15 DIAGNOSIS — H9190 Unspecified hearing loss, unspecified ear: Secondary | ICD-10-CM

## 2022-04-15 DIAGNOSIS — E538 Deficiency of other specified B group vitamins: Secondary | ICD-10-CM

## 2022-04-15 DIAGNOSIS — E785 Hyperlipidemia, unspecified: Secondary | ICD-10-CM

## 2022-04-15 DIAGNOSIS — I890 Lymphedema, not elsewhere classified: Secondary | ICD-10-CM

## 2022-04-15 DIAGNOSIS — M1990 Unspecified osteoarthritis, unspecified site: Secondary | ICD-10-CM

## 2022-04-15 DIAGNOSIS — I10 Essential (primary) hypertension: Secondary | ICD-10-CM | POA: Diagnosis not present

## 2022-04-15 DIAGNOSIS — K5900 Constipation, unspecified: Secondary | ICD-10-CM

## 2022-04-15 DIAGNOSIS — R739 Hyperglycemia, unspecified: Secondary | ICD-10-CM | POA: Diagnosis not present

## 2022-04-15 DIAGNOSIS — E2839 Other primary ovarian failure: Secondary | ICD-10-CM

## 2022-04-15 LAB — HEMOGLOBIN A1C: Hgb A1c MFr Bld: 5.8 % (ref 4.6–6.5)

## 2022-04-15 LAB — LIPID PANEL
Cholesterol: 202 mg/dL — ABNORMAL HIGH (ref 0–200)
HDL: 86.6 mg/dL (ref >39.00)
LDL Cholesterol: 91 mg/dL (ref 0–99)
NonHDL: 115.86
Total CHOL/HDL Ratio: 2
Triglycerides: 122 mg/dL (ref 0.0–149.0)
VLDL: 24.4 mg/dL (ref 0.0–40.0)

## 2022-04-15 LAB — CBC
HCT: 40.7 % (ref 36.0–46.0)
Hemoglobin: 13.3 g/dL (ref 12.0–15.0)
MCHC: 32.8 g/dL (ref 30.0–36.0)
MCV: 97.3 fl (ref 78.0–100.0)
Platelets: 298 10*3/uL (ref 150.0–400.0)
RBC: 4.18 Mil/uL (ref 3.87–5.11)
RDW: 14.1 % (ref 11.5–15.5)
WBC: 4.4 10*3/uL (ref 4.0–10.5)

## 2022-04-15 LAB — COMPREHENSIVE METABOLIC PANEL
ALT: 11 U/L (ref 0–35)
AST: 15 U/L (ref 0–37)
Albumin: 3.9 g/dL (ref 3.5–5.2)
Alkaline Phosphatase: 55 U/L (ref 39–117)
BUN: 13 mg/dL (ref 6–23)
CO2: 28 mEq/L (ref 19–32)
Calcium: 9.3 mg/dL (ref 8.4–10.5)
Chloride: 104 mEq/L (ref 96–112)
Creatinine, Ser: 0.58 mg/dL (ref 0.40–1.20)
GFR: 80.2 mL/min (ref >60.00)
Glucose, Bld: 91 mg/dL (ref 70–99)
Potassium: 4 mEq/L (ref 3.5–5.1)
Sodium: 141 mEq/L (ref 135–145)
Total Bilirubin: 0.5 mg/dL (ref 0.2–1.2)
Total Protein: 5.9 g/dL — ABNORMAL LOW (ref 6.0–8.3)

## 2022-04-15 LAB — TSH: TSH: 2.78 u[IU]/mL (ref 0.35–5.50)

## 2022-04-15 LAB — VITAMIN B12: Vitamin B-12: 307 pg/mL (ref 211–911)

## 2022-04-15 LAB — VITAMIN D 25 HYDROXY (VIT D DEFICIENCY, FRACTURES): VITD: 35.97 ng/mL (ref 30.00–100.00)

## 2022-04-15 NOTE — Assessment & Plan Note (Signed)
Check vitamin D.  Currently taking multivitamin.  Not on any specific vitamin D supplementation.

## 2022-04-15 NOTE — Assessment & Plan Note (Signed)
Uses over-the-counter laxative though occasionally has loose stools.  We discussed fiber supplementation with Metamucil or Benefiber.  Will check labs today.

## 2022-04-15 NOTE — Assessment & Plan Note (Signed)
Check lipids.  She would like to avoid meds if possible.

## 2022-04-15 NOTE — Patient Instructions (Signed)
It was very nice to see you today!  We will check blood work today.  I will refer you to see the audiologist.  Please try taking a fiber supplement such as benefiber or metamucil.  Come back in 6 months.  Please come back sooner if needed.  Take care, Dr [name]  PLEASE NOTE:  If you had any lab tests, please let us know if you have not heard back within a few days. You may see your results on mychart before we have a chance to review them but we will give you a call once they are reviewed by [place].   If we ordered any referrals today, please let us know if you have not heard from their office within the next week.   If you had any urgent prescriptions sent in today, please check with the pharmacy within an hour of our visit to make sure the prescription was transmitted appropriately.   Please try these tips to maintain a healthy lifestyle:  Eat at least 3 REAL meals and 1-2 snacks per day.  Aim for no more than 5 hours between eating.  If you eat breakfast, please do so within one hour of getting up.   Each meal should contain half fruits/vegetables, one quarter protein, and one quarter carbs (no bigger than a computer mouse)  Cut down on sweet beverages. This includes juice, soda, and sweet tea.   Drink at least 1 glass of water with each meal and aim for at least 8 glasses per day  Exercise at least 150 minutes every week.

## 2022-04-15 NOTE — Assessment & Plan Note (Signed)
Managed conservatively.  Uses SCDs at home.

## 2022-04-15 NOTE — Assessment & Plan Note (Signed)
Blood pressure at goal today without meds. 

## 2022-04-15 NOTE — Assessment & Plan Note (Signed)
Check A1c today.

## 2022-04-15 NOTE — Assessment & Plan Note (Signed)
Will place referral to audiology per patient request.  She has previously been told that she needs a hearing aid.

## 2022-04-15 NOTE — Assessment & Plan Note (Signed)
Still has quite a bit of pain in her shoulders and feet.  She can use over-the-counter meds as needed.

## 2022-04-15 NOTE — Progress Notes (Signed)
   Katelyn Estes is a 89 y.o. female who presents today for an office visit.  Assessment/Plan:  Chronic Problems Addressed Today: Essential hypertension Blood pressure at goal today without meds.  Dyslipidemia Check lipids.  She would like to avoid meds if possible.  Hyperglycemia Check A1c today.  Vitamin D deficiency Check vitamin D.  Currently taking multivitamin.  Not on any specific vitamin D supplementation.  Decreased hearing Will place referral to audiology per patient request.  She has previously been told that she needs a hearing aid.  Osteoarthritis Still has quite a bit of pain in her shoulders and feet.  She can use over-the-counter meds as needed.    Lymphedema Managed conservatively.  Uses SCDs at home.  Constipation Uses over-the-counter laxative though occasionally has loose stools.  We discussed fiber supplementation with Metamucil or Benefiber.  Will check labs today.  Preventative health care Check labs.  Order DEXA.    Subjective:  HPI:  See A/p for status of chronic conditions.  She has no acute concerns today.        Objective:  Physical Exam: BP 131/81   Pulse 86   Temp 98.2 F (36.8 C) (Temporal)   Ht 5\' 2"  (1.575 m)   Wt 190 lb (86.2 kg)   SpO2 96%   BMI 34.75 kg/m   Gen: No acute distress, resting comfortably CV: Regular rate and rhythm with 2/6 systolic murmurs appreciated Pulm: Normal work of breathing, clear to auscultation bilaterally with no crackles, wheezes, or rhonchi Neuro: Grossly normal, moves all extremities Psych: Normal affect and thought content      Aadan Chenier M. [name], MD 04/15/2022 8:38 AM

## 2022-04-16 ENCOUNTER — Ambulatory Visit (INDEPENDENT_AMBULATORY_CARE_PROVIDER_SITE_OTHER)
Admission: RE | Admit: 2022-04-16 | Discharge: 2022-04-16 | Disposition: A | Discharge status: Home / Self Care | Payer: Medicare Other | Source: Ambulatory Visit | Attending: Family Medicine | Admitting: Family Medicine

## 2022-04-16 DIAGNOSIS — E2839 Other primary ovarian failure: Secondary | ICD-10-CM

## 2022-04-16 NOTE — Progress Notes (Signed)
Please inform patient of the following:  Good news! Labs are all stable. Do not need to make any changes to her treatment plan at this time. We can recheck in 6 months.

## 2022-04-21 NOTE — Progress Notes (Signed)
Please inform patient of the following:  Her bone density scan indicates that she has osteoporosis. Her  risk of having a hip fracture over the next 10 years is 3.7%. She should be taking a vitamin D and calcium supplementation already but may benefit from starting a prescription medication to prevent hip fracture. Recommend she schedule an appointment to discuss.

## 2022-04-28 ENCOUNTER — Ambulatory Visit (INDEPENDENT_AMBULATORY_CARE_PROVIDER_SITE_OTHER): Payer: Medicare Other | Admitting: Podiatry

## 2022-04-28 ENCOUNTER — Encounter: Payer: Self-pay | Admitting: Podiatry

## 2022-04-28 VITALS — BP 130/78

## 2022-04-28 DIAGNOSIS — B351 Tinea unguium: Secondary | ICD-10-CM

## 2022-04-28 DIAGNOSIS — L853 Xerosis cutis: Secondary | ICD-10-CM | POA: Diagnosis not present

## 2022-04-28 DIAGNOSIS — M79675 Pain in left toe(s): Secondary | ICD-10-CM | POA: Diagnosis not present

## 2022-04-28 DIAGNOSIS — M79674 Pain in right toe(s): Secondary | ICD-10-CM | POA: Diagnosis not present

## 2022-04-28 MED ORDER — AMMONIUM LACTATE 12 % EX LOTN: TOPICAL_LOTION | CUTANEOUS | 5 refills | Status: AC

## 2022-04-28 NOTE — Progress Notes (Signed)
  Subjective:  Patient ID: Katelyn Estes, female    DOB: 18-Jan-1931,  MRN: [id number]  Katelyn Estes presents to clinic today for painful elongated mycotic toenails 1-5 bilaterally which are tender when wearing enclosed shoe gear. Pain is relieved with periodic professional debridement.  Chief Complaint  Patient presents with   Nail Problem    RFC PCP-Caleb Parker PCP VST-2 months ago   New problem(s): None.   PCP is Vivi Barrack, MD.  No Known Allergies  Review of Systems: Negative except as noted in the HPI.  Objective: No changes noted in today's physical examination. Vitals:   04/28/22 1413  BP: 130/78   Katelyn Estes is a pleasant 89 y.o. female in NAD. AAO x 3.  Vascular Examination: Palpable pedal pulses b/l LE. Digital hair present b/l. No pedal edema b/l. Skin temperature gradient WNL b/l. No varicosities b/l. No edema noted b/l LE.Marland Kitchen  Dermatological Examination: Pedal skin with normal turgor, texture and tone b/l. No open wounds. No interdigital macerations b/l. Toenails 1-5 b/l thickened, discolored, dystrophic with subungual debris. There is pain on palpation to dorsal aspect of nailplates.  Pedal skin noted to be dry b/l feet.  Neurological Examination: Protective sensation intact with 10 gram monofilament b/l LE. Vibratory sensation intact b/l LE.   Musculoskeletal Examination: Muscle strength 5/5 to all LE muscle groups b/l. No pain, crepitus or joint limitation noted with ROM bilateral LE. No gross bony deformities bilaterally.  Assessment/Plan: 1. Pain due to onychomycosis of toenails of both feet   2. Xerosis cutis     Meds ordered this encounter  Medications   ammonium lactate (LAC-HYDRIN) 12 % lotion    Sig: Apply to both feet up to twice daily. Avoid application between toes.    Dispense:  400 g    Refill:  5    -Consent given for treatment as described below: -Examined patient. -Continue supportive shoe gear daily. -Toenails 1-5 b/l were  debrided in length and girth with sterile nail nippers and dremel without iatrogenic bleeding.  -Patient/POA to call should there be question/concern in the interim.   Return in about 3 months (around 07/28/2022).  Marzetta Board, DPM

## 2022-04-29 ENCOUNTER — Telehealth: Payer: Self-pay | Admitting: Podiatry

## 2022-04-29 ENCOUNTER — Other Ambulatory Visit: Payer: Self-pay

## 2022-04-29 NOTE — Telephone Encounter (Signed)
Pt was seen yesterday and called to inform what medication she was in need of Ammonium lactate Lotion.  Please advise

## 2022-04-29 NOTE — Telephone Encounter (Signed)
Patient is aware and will call her pharmacy.

## 2022-05-01 ENCOUNTER — Ambulatory Visit (INDEPENDENT_AMBULATORY_CARE_PROVIDER_SITE_OTHER): Payer: Medicare Other | Admitting: Family Medicine

## 2022-05-01 ENCOUNTER — Encounter: Payer: Self-pay | Admitting: Family Medicine

## 2022-05-01 VITALS — BP 122/77 | HR 67 | Temp 97.3°F | Ht 62.0 in | Wt 192.2 lb

## 2022-05-01 DIAGNOSIS — M6740 Ganglion, unspecified site: Secondary | ICD-10-CM | POA: Diagnosis not present

## 2022-05-01 DIAGNOSIS — I10 Essential (primary) hypertension: Secondary | ICD-10-CM | POA: Diagnosis not present

## 2022-05-01 DIAGNOSIS — M810 Age-related osteoporosis without current pathological fracture: Secondary | ICD-10-CM | POA: Diagnosis not present

## 2022-05-01 MED ORDER — ALENDRONATE SODIUM 70 MG PO TABS: 70.0000 mg | ORAL_TABLET | ORAL | 4 refills | Status: DC

## 2022-05-01 NOTE — Assessment & Plan Note (Signed)
Blood pressure at goal today without medications. 

## 2022-05-01 NOTE — Progress Notes (Signed)
   Katelyn Estes is a 89 y.o. female who presents today for an office visit.  Assessment/Plan:  New/Acute Problems: Ganglion cyst Aspiration performed today.  See below procedure note.  She tolerated well.  Would consider referral to sports medicine if this continues to be an issue.  Cerumen impaction Successfully irrigated by RMA today.   Chronic Problems Addressed Today: Osteoporosis T-score on last DEXA -1.9 however 3.7% risk of hip fracture over the next 10 years.  She is agreeable to start treatment for this.  We discussed options.  Will start Fosamax 70 mg once weekly.  We discussed potential side effects.  Repeat bone density scan in 2 years.  We discussed calcium and vitamin D supplementation.  Essential hypertension Blood pressure at goal today without medications.     Subjective:  HPI:  Patient here today for follow-up.  We last saw her couple weeks ago.  Since her last visit she got her bone density scan which showed osteoporosis.  She is here today to discuss treatment options.  She has never been on treatment for this before.  She also has nodules on fingers she would like for me to look at.  We look at this a few months ago.  She was diagnosed with ganglion cyst.  Symptoms have persisted.  Has a cyst on most of her fingers however most predominant at her PIP joint of left second digit.  Some pain to the area.  Some overlying itching.       Objective:  Physical Exam: BP 122/77 (BP Location: Left Arm, Patient Position: Sitting)   Pulse 67   Temp (!) 97.3 F (36.3 C) (Temporal)   Ht 5\' 2"  (1.575 m)   Wt 192 lb 3.2 oz (87.2 kg)   SpO2 (!) 80%   BMI 35.15 kg/m   Gen: No acute distress, resting comfortably HEENT: Salmon patch in bilateral EAC. CV: Regular rate and rhythm with no murmurs appreciated Pulm: Normal work of breathing, clear to auscultation bilaterally with no crackles, wheezes, or rhonchi MSK - Hand: Ganglion cyst noted at PIP joint of left second  digit. Neuro: Grossly normal, moves all extremities Psych: Normal affect and thought content  Ganglion Cyst Aspiration/Injection Verbal consent was obtained. The left second DIP joint was prepped with Betadine solution. Topical ethyl chloride was applied.  Needle was then inserted into the ganglion cyst. Approximately 1 ccs of material was aspirated. Needle was then removed. Bandage was applied. Patient tolerated procedure well. Minimal blood loss. No complications.        Algis Greenhouse. [name], MD 05/01/2022 9:49 AM

## 2022-05-01 NOTE — Assessment & Plan Note (Signed)
T-score on last DEXA -1.9 however 3.7% risk of hip fracture over the next 10 years.  She is agreeable to start treatment for this.  We discussed options.  Will start Fosamax 70 mg once weekly.  We discussed potential side effects.  Repeat bone density scan in 2 years.  We discussed calcium and vitamin D supplementation.

## 2022-05-01 NOTE — Patient Instructions (Signed)
It was very nice to see you today!  Please start Fosamax.  Take once weekly.  Let me know if you have any side effects.  Will repeat your bone density scan in 2 years.  We drew fluid out of the cyst on your finger today.  Let me know if this comes back and I can refer you to see a sports medicine doctor and orthopedics.  We flushed out your ears today.  I will see back in the summer for your next follow-up visit.  Come back to see [place] sooner if needed.  Take care, Dr [name]  PLEASE NOTE:  If you had any lab tests, please let us know if you have not heard back within a few days. You may see your results on mychart before we have a chance to review them but we will give you a call once they are reviewed by [place].   If we ordered any referrals today, please let us know if you have not heard from their office within the next week.   If you had any urgent prescriptions sent in today, please check with the pharmacy within an hour of our visit to make sure the prescription was transmitted appropriately.   Please try these tips to maintain a healthy lifestyle:  Eat at least 3 REAL meals and 1-2 snacks per day.  Aim for no more than 5 hours between eating.  If you eat breakfast, please do so within one hour of getting up.   Each meal should contain half fruits/vegetables, one quarter protein, and one quarter carbs (no bigger than a computer mouse)  Cut down on sweet beverages. This includes juice, soda, and sweet tea.   Drink at least 1 glass of water with each meal and aim for at least 8 glasses per day  Exercise at least 150 minutes every week.

## 2022-05-28 ENCOUNTER — Ambulatory Visit: Payer: Medicare Other

## 2022-05-28 ENCOUNTER — Ambulatory Visit
Admission: RE | Admit: 2022-05-28 | Discharge: 2022-05-28 | Disposition: A | Discharge status: Home / Self Care | Payer: Medicare Other | Source: Ambulatory Visit | Attending: Family Medicine | Admitting: Family Medicine

## 2022-05-28 DIAGNOSIS — Z1231 Encounter for screening mammogram for malignant neoplasm of breast: Secondary | ICD-10-CM

## 2022-06-01 ENCOUNTER — Other Ambulatory Visit: Payer: Self-pay | Admitting: Family Medicine

## 2022-06-01 DIAGNOSIS — Z1231 Encounter for screening mammogram for malignant neoplasm of breast: Secondary | ICD-10-CM

## 2022-07-07 ENCOUNTER — Ambulatory Visit (INDEPENDENT_AMBULATORY_CARE_PROVIDER_SITE_OTHER): Payer: Medicare Other | Admitting: Podiatrist

## 2022-07-07 DIAGNOSIS — M79675 Pain in left toe(s): Secondary | ICD-10-CM

## 2022-07-07 DIAGNOSIS — B351 Tinea unguium: Secondary | ICD-10-CM

## 2022-07-07 DIAGNOSIS — M79674 Pain in right toe(s): Secondary | ICD-10-CM

## 2022-07-07 NOTE — Patient Instructions (Signed)
Medicated foot creams with menthol are helpful for areas of thickened skin.  Flexitol heel balm is specifically good for thick skin on heels  Body makeup can help with redness and discoloration of the legs due to edema.

## 2022-07-07 NOTE — Progress Notes (Signed)
  Subjective:  Patient ID: Katelyn Estes, female    DOB: Jul 31, 1930,  MRN: [id number]  Katelyn Estes presents to clinic today for painful elongated mycotic toenails 1-5 bilaterally which are tender when wearing enclosed shoe gear. Pain is relieved with periodic professional debridement.  Chief Complaint  Patient presents with   Nail Problem    [facility] PCP-[facility] PCP VST-05/2022   New problem(s): None.   PCP is Ardith Dark, MD.  No Known Allergies  Review of Systems: Negative except as noted in the HPI.  Objective: No changes noted in today's physical examination. There were no vitals filed for this visit.  Katelyn Estes is a pleasant 88 y.o. female in NAD. AAO x 3.  Vascular Examination: Palpable pedal pulses b/l LE. Digital hair present b/l. No pedal edema b/l. Skin temperature gradient WNL b/l. No varicosities b/l. No edema noted b/l LE.Marland Kitchen  Dermatological Examination: Pedal skin with normal turgor, texture and tone b/l. No open wounds. No interdigital macerations b/l. Toenails 1-5 b/l thickened, discolored, dystrophic with subungual debris. There is pain on palpation to dorsal aspect of nailplates.  Pedal skin noted to be dry b/l feet.  Neurological Examination: Protective sensation intact with 10 gram monofilament b/l LE. Vibratory sensation intact b/l LE.   Musculoskeletal Examination: Muscle strength 5/5 to all LE muscle groups b/l. No pain, crepitus or joint limitation noted with ROM bilateral LE. No gross bony deformities bilaterally.  Assessment/Plan: No diagnosis found.   No orders of the defined types were placed in this encounter.   -Consent given for treatment as described below: -Examined patient. -Continue supportive shoe gear daily. -Toenails 1-5 b/l were debrided in length and girth with sterile nail nippers and dremel without iatrogenic bleeding.  -Patient/POA to call should there be question/concern in the interim.   Return appt requested for 62  days  Delories Heinz, DPM

## 2022-07-08 ENCOUNTER — Telehealth: Payer: Self-pay

## 2022-07-08 ENCOUNTER — Telehealth: Payer: Self-pay | Admitting: Family Medicine

## 2022-07-08 NOTE — Telephone Encounter (Signed)
Contacted Katelyn Estes to schedule their annual wellness visit. Appointment made for 07/20/2022.  [facility] Direct Dial [phone]

## 2022-07-15 NOTE — Telephone Encounter (Signed)
No further information is needed from me. 

## 2022-07-20 ENCOUNTER — Ambulatory Visit (INDEPENDENT_AMBULATORY_CARE_PROVIDER_SITE_OTHER): Payer: Medicare Other

## 2022-07-20 VITALS — BP 130/80 | HR 86 | Temp 98.7°F | Wt 190.2 lb

## 2022-07-20 DIAGNOSIS — Z0000 Encounter for general adult medical examination without abnormal findings: Secondary | ICD-10-CM | POA: Diagnosis not present

## 2022-07-20 NOTE — Progress Notes (Signed)
Subjective:   Katelyn MaximDoris J Estes is a 89 y.o. female who presents for an Initial Medicare Annual Wellness Visit.  Review of Systems     Cardiac Risk Factors include: advanced age (>3555men, 3>65 women);obesity (BMI >30kg/m2);hypertension     Objective:    Today's Vitals   07/20/22 1558  BP: 130/80  Pulse: 86  Temp: 98.7 F (37.1 C)  SpO2: 98%  Weight: 190 lb 3.2 oz (86.3 kg)   Body mass index is 34.79 kg/m.     10/16/2021   12:50 PM  Advanced Directives  Does Patient Have a Medical Advance Directive? No  Would patient like information on creating a medical advance directive? No - Patient declined    Current Medications (verified) Outpatient Encounter Medications as of 07/20/2022  Medication Sig   ammonium lactate (LAC-HYDRIN) 12 % lotion Apply to both feet up to twice daily. Avoid application between toes.   aspirin EC 81 MG tablet Take 81 mg by mouth daily. Swallow whole.   Calcium Carbonate (CALCIUM 500 PO) Take by mouth.   Cyanocobalamin (VITAMIN B 12 PO) Take 1 tablet by mouth daily.   nitroGLYCERIN (NITROSTAT) 0.4 MG SL tablet Place 1 tablet (0.4 mg total) under the tongue every 5 (five) minutes as needed for chest pain.   VITAMIN D, CHOLECALCIFEROL, PO Take 1 tablet by mouth daily.   fluorouracil (EFUDEX) 5 % cream Apply topically 2 (two) times daily. (Patient not taking: Reported on 07/20/2022)   [DISCONTINUED] alendronate (FOSAMAX) 70 MG tablet Take 1 tablet (70 mg total) by mouth every 7 (seven) days. Take with a full glass of water on an empty stomach.   No facility-administered encounter medications on file as of 07/20/2022.    Allergies (verified) Patient has no known allergies.   History: Past Medical History:  Diagnosis Date   Arthritis    Cataracts, bilateral    Congestive heart disease    Past Surgical History:  Procedure Laterality Date   BACK SURGERY  2005   REPLACEMENT TOTAL KNEE BILATERAL  2003   TONSILLECTOMY  1939   History reviewed. No  pertinent family history. Social History   Socioeconomic History   Marital status: Widowed    Spouse name: Not on file   Number of children: Not on file   Years of education: Not on file   Highest education level: Not on file  Occupational History   Not on file  Tobacco Use   Smoking status: Never   Smokeless tobacco: Never  Substance and Sexual Activity   Alcohol use: Yes    Comment: occ   Drug use: Never   Sexual activity: Not on file  Other Topics Concern   Not on file  Social History Narrative   Not on file   Social Determinants of Health   Financial Resource Strain: Low Risk  (07/20/2022)   Overall Financial Resource Strain (CARDIA)    Difficulty of Paying Living Expenses: Not hard at all  Food Insecurity: No Food Insecurity (07/20/2022)   Hunger Vital Sign    Worried About Running Out of Food in the Last Year: Never true    Ran Out of Food in the Last Year: Never true  Transportation Needs: No Transportation Needs (07/20/2022)   PRAPARE - Administrator, Civil ServiceTransportation    Lack of Transportation (Medical): No    Lack of Transportation (Non-Medical): No  Physical Activity: Insufficiently Active (07/20/2022)   Exercise Vital Sign    Days of Exercise per Week: 1 day    Minutes  of Exercise per Session: 50 min  Stress: No Stress Concern Present (07/20/2022)   [employer] of Occupational Health - Occupational Stress Questionnaire    Feeling of Stress : Not at all  Social Connections: Moderately Integrated (07/20/2022)   Social Connection and Isolation Panel [NHANES]    Frequency of Communication with Friends and Family: More than three times a week    Frequency of Social Gatherings with Friends and Family: More than three times a week    Attends Religious Services: More than 4 times per year    Active Member of [employer] or Organizations: Yes    Attends Banker Meetings: 1 to 4 times per year    Marital Status: Widowed    Tobacco Counseling Counseling given: Not  Answered   Clinical Intake:  Pre-visit preparation completed: Yes  Pain : No/denies pain     BMI - recorded: 34.79 Nutritional Status: BMI > 30  Obese Nutritional Risks: None Diabetes: No  How often do you need to have someone help you when you read instructions, pamphlets, or other written materials from your doctor or pharmacy?: 1 - Never  Diabetic?no  Interpreter Needed?: No  Information entered by :: Lanier Ensign, LPN   Activities of Daily Living    07/20/2022    4:16 PM  In your present state of health, do you have any difficulty performing the following activities:  Hearing? 1  Comment HOH  Vision? 0  Difficulty concentrating or making decisions? 0  Walking or climbing stairs? 0  Comment no stairs  Dressing or bathing? 0  Doing errands, shopping? 0  Preparing Food and eating ? N  Using the Toilet? N  In the past six months, have you accidently leaked urine? N  Do you have problems with loss of bowel control? N  Managing your Medications? N  Managing your Finances? N  Housekeeping or managing your Housekeeping? N    Patient Care Team: Ardith Dark, MD as PCP - General (Family Medicine) Wendall Stade, MD as PCP - Cardiology (Cardiology)  Indicate any recent Medical Services you may have received from other than [facility] providers in the past year (date may be approximate).     Assessment:   This is a routine wellness examination for Katelyn Estes.  Hearing/Vision screen Hearing Screening - Comments:: Pt stated HOH  Vision Screening - Comments:: Pt follows up with Dr London Sheer For annual eye exams   Dietary issues and exercise activities discussed: Current Exercise Habits: Home exercise routine, Type of exercise: Other - see comments, Time (Minutes): 50, Frequency (Times/Week): 1, Weekly Exercise (Minutes/Week): 50   Goals Addressed             This Visit's Progress    Patient Stated       Lose weight        Depression Screen    07/20/2022     4:12 PM 04/15/2022    8:07 AM 12/30/2021   12:50 PM 10/15/2021    8:03 AM 01/13/2021    1:07 PM  PHQ 2/9 Scores  PHQ - 2 Score 0 0 0 0 0    Fall Risk    07/20/2022    4:16 PM 05/01/2022    9:03 AM 04/15/2022    8:07 AM 12/30/2021   12:50 PM 10/15/2021    8:03 AM  Fall Risk   Falls in the past year? 0 0 0 0 0  Number falls in past yr: 0 0 0 0 0  Injury with Fall? 0 0 0 0 0  Risk for fall due to : Impaired balance/gait No Fall Risks No Fall Risks No Fall Risks No Fall Risks;Impaired mobility  Follow up Falls prevention discussed Falls evaluation completed       FALL RISK PREVENTION PERTAINING TO THE HOME:  Any stairs in or around the home? No  If so, are there any without handrails? No  Home free of loose throw rugs in walkways, pet beds, electrical cords, etc? Yes  Adequate lighting in your home to reduce risk of falls? Yes   ASSISTIVE DEVICES UTILIZED TO PREVENT FALLS:  Life alert? No  Use of a cane, walker or w/c? Yes  Grab bars in the bathroom? Yes  Shower chair or bench in shower? Yes  Elevated toilet seat or a handicapped toilet? Yes   TIMED UP AND GO:  Was the test performed? Yes .  Length of time to ambulate 10 feet: 30 sec.   Gait slow and steady with assistive device  Cognitive Function:        07/20/2022    4:19 PM  6CIT Screen  What Year? 0 points  What month? 0 points  What time? 0 points  Count back from 20 0 points  Months in reverse 2 points  Repeat phrase 2 points  Total Score 4 points    Immunizations Immunization History  Administered Date(s) Administered   PFIZER(Purple Top)SARS-COV-2 Vaccination 05/10/2019, 05/31/2019, 03/19/2020   Pneumococcal Polysaccharide-23 04/10/2013      Flu Vaccine status: Declined, Education has been provided regarding the importance of this vaccine but patient still declined. Advised may receive this vaccine at local pharmacy or Health Dept. Aware to provide a copy of the vaccination record if obtained from local  pharmacy or Health Dept. Verbalized acceptance and understanding.  Pneumococcal vaccine status: Due, Education has been provided regarding the importance of this vaccine. Advised may receive this vaccine at local pharmacy or Health Dept. Aware to provide a copy of the vaccination record if obtained from local pharmacy or Health Dept. Verbalized acceptance and understanding.  Covid-19 vaccine status: Completed vaccines  Qualifies for Shingles Vaccine? Yes   Zostavax completed No   Shingrix Completed?: No.    Education has been provided regarding the importance of this vaccine. Patient has been advised to call insurance company to determine out of pocket expense if they have not yet received this vaccine. Advised may also receive vaccine at local pharmacy or Health Dept. Verbalized acceptance and understanding.  Screening Tests Health Maintenance  Topic Date Due   Pneumonia Vaccine 81+ Years old (2 of 2 - PCV) 10/16/2022 (Originally 04/10/2014)   Zoster Vaccines- Shingrix (1 of 2) 10/19/2022 (Originally 10/23/1982)   INFLUENZA VACCINE  11/12/2022   Medicare Annual Wellness (AWV)  07/20/2023   DEXA SCAN  04/16/2024   HPV VACCINES  Aged Out   DTaP/Tdap/Td  Discontinued   COVID-19 Vaccine  Discontinued    Health Maintenance  There are no preventive care reminders to display for this patient.   Colorectal cancer screening: No longer required.   Mammogram status: Completed 05/28/22. Repeat every year  Bone Density status: Completed 04/16/22. Results reflect: Bone density results: OSTEOPOROSIS. Repeat every 2 years.  Additional Screening:   Vision Screening: Recommended annual ophthalmology exams for early detection of glaucoma and other disorders of the eye. Is the patient up to date with their annual eye exam?  Yes  Who is the provider or what is the name of the office  in which the patient attends annual eye exams? Dr London Sheer  If pt is not established with a provider, would they like  to be referred to a provider to establish care? No .   Dental Screening: Recommended annual dental exams for proper oral hygiene  Community Resource Referral / Chronic Care Management: CRR required this visit?  No   CCM required this visit?  No      Plan:     I have personally reviewed and noted the following in the patient's chart:   Medical and social history Use of alcohol, tobacco or illicit drugs  Current medications and supplements including opioid prescriptions. Patient is not currently taking opioid prescriptions. Functional ability and status Nutritional status Physical activity Advanced directives List of other physicians Hospitalizations, surgeries, and ER visits in previous 12 months Vitals Screenings to include cognitive, depression, and falls Referrals and appointments  In addition, I have reviewed and discussed with patient certain preventive protocols, quality metrics, and best practice recommendations. A written personalized care plan for preventive services as well as general preventive health recommendations were provided to patient.     Marzella Schlein, LPN   04/16/863   Nurse Notes: none

## 2022-07-20 NOTE — Patient Instructions (Signed)
Katelyn Estes , Thank you for taking time to come for your Medicare Wellness Visit. I appreciate your ongoing commitment to your health goals. Please review the following plan we discussed and let me know if I can assist you in the future.   These are the goals we discussed:  Goals   None     This is a list of the screening recommended for you and due dates:  Health Maintenance  Topic Date Due   Medicare Annual Wellness Visit  Never done   Zoster (Shingles) Vaccine (1 of 2) Never done   Pneumonia Vaccine (2 of 2 - PCV) 10/16/2022*   Flu Shot  11/12/2022   DEXA scan (bone density measurement)  04/16/2024   HPV Vaccine  Aged Out   DTaP/Tdap/Td vaccine  Discontinued   COVID-19 Vaccine  Discontinued  *Topic was postponed. The date shown is not the original due date.    Advanced directives: Please bring a copy of your health care power of attorney and living will to the office at your convenience.  Conditions/risks identified: lose weight   Next appointment: Follow up in one year for your annual wellness visit    Preventive Care 65 Years and Older, Female Preventive care refers to lifestyle choices and visits with your health care provider that can promote health and wellness. What does preventive care include? A yearly physical exam. This is also called an annual well check. Dental exams once or twice a year. Routine eye exams. Ask your health care provider how often you should have your eyes checked. Personal lifestyle choices, including: Daily care of your teeth and gums. Regular physical activity. Eating a healthy diet. Avoiding tobacco and drug use. Limiting alcohol use. Practicing safe sex. Taking low-dose aspirin every day. Taking vitamin and mineral supplements as recommended by your health care provider. What happens during an annual well check? The services and screenings done by your health care provider during your annual well check will depend on your age, overall  health, lifestyle risk factors, and family history of disease. Counseling  Your health care provider may ask you questions about your: Alcohol use. Tobacco use. Drug use. Emotional well-being. Home and relationship well-being. Sexual activity. Eating habits. History of falls. Memory and ability to understand (cognition). Work and work Astronomer. Reproductive health. Screening  You may have the following tests or measurements: Height, weight, and BMI. Blood pressure. Lipid and cholesterol levels. These may be checked every 5 years, or more frequently if you are over 1 years old. Skin check. Lung cancer screening. You may have this screening every year starting at age 38 if you have a 30-pack-year history of smoking and currently smoke or have quit within the past 15 years. Fecal occult blood test (FOBT) of the stool. You may have this test every year starting at age 59. Flexible sigmoidoscopy or colonoscopy. You may have a sigmoidoscopy every 5 years or a colonoscopy every 10 years starting at age 74. Hepatitis C blood test. Hepatitis B blood test. Sexually transmitted disease (STD) testing. Diabetes screening. This is done by checking your blood sugar (glucose) after you have not eaten for a while (fasting). You may have this done every 1-3 years. Bone density scan. This is done to screen for osteoporosis. You may have this done starting at age 71. Mammogram. This may be done every 1-2 years. Talk to your health care provider about how often you should have regular mammograms. Talk with your health care provider about your test  results, treatment options, and if necessary, the need for more tests. Vaccines  Your health care provider may recommend certain vaccines, such as: Influenza vaccine. This is recommended every year. Tetanus, diphtheria, and acellular pertussis (Tdap, Td) vaccine. You may need a Td booster every 10 years. Zoster vaccine. You may need this after age  46. Pneumococcal 13-valent conjugate (PCV13) vaccine. One dose is recommended after age 87. Pneumococcal polysaccharide (PPSV23) vaccine. One dose is recommended after age 20. Talk to your health care provider about which screenings and vaccines you need and how often you need them. This information is not intended to replace advice given to you by your health care provider. Make sure you discuss any questions you have with your health care provider. Document Released: 04/26/2015 Document Revised: 12/18/2015 Document Reviewed: 01/29/2015 Elsevier Interactive Patient Education  2017 [place] Prevention in the Home Falls can cause injuries. They can happen to people of all ages. There are many things you can do to make your home safe and to help prevent falls. What can I do on the outside of my home? Regularly fix the edges of walkways and driveways and fix any cracks. Remove anything that might make you trip as you walk through a door, such as a raised step or threshold. Trim any bushes or trees on the path to your home. Use bright outdoor lighting. Clear any walking paths of anything that might make someone trip, such as rocks or tools. Regularly check to see if handrails are loose or broken. Make sure that both sides of any steps have handrails. Any raised decks and porches should have guardrails on the edges. Have any leaves, snow, or ice cleared regularly. Use sand or salt on walking paths during winter. Clean up any spills in your garage right away. This includes oil or grease spills. What can I do in the bathroom? Use night lights. Install grab bars by the toilet and in the tub and shower. Do not use towel bars as grab bars. Use non-skid mats or decals in the tub or shower. If you need to sit down in the shower, use a plastic, non-slip stool. Keep the floor dry. Clean up any water that spills on the floor as soon as it happens. Remove soap buildup in the tub or shower  regularly. Attach bath mats securely with double-sided non-slip rug tape. Do not have throw rugs and other things on the floor that can make you trip. What can I do in the bedroom? Use night lights. Make sure that you have a light by your bed that is easy to reach. Do not use any sheets or blankets that are too big for your bed. They should not hang down onto the floor. Have a firm chair that has side arms. You can use this for support while you get dressed. Do not have throw rugs and other things on the floor that can make you trip. What can I do in the kitchen? Clean up any spills right away. Avoid walking on wet floors. Keep items that you use a lot in easy-to-reach places. If you need to reach something above you, use a strong step stool that has a grab bar. Keep electrical cords out of the way. Do not use floor polish or wax that makes floors slippery. If you must use wax, use non-skid floor wax. Do not have throw rugs and other things on the floor that can make you trip. What can I do with my stairs?  Do not leave any items on the stairs. Make sure that there are handrails on both sides of the stairs and use them. Fix handrails that are broken or loose. Make sure that handrails are as long as the stairways. Check any carpeting to make sure that it is firmly attached to the stairs. Fix any carpet that is loose or worn. Avoid having throw rugs at the top or bottom of the stairs. If you do have throw rugs, attach them to the floor with carpet tape. Make sure that you have a light switch at the top of the stairs and the bottom of the stairs. If you do not have them, ask someone to add them for you. What else can I do to help prevent falls? Wear shoes that: Do not have high heels. Have rubber bottoms. Are comfortable and fit you well. Are closed at the toe. Do not wear sandals. If you use a stepladder: Make sure that it is fully opened. Do not climb a closed stepladder. Make sure that  both sides of the stepladder are locked into place. Ask someone to hold it for you, if possible. Clearly mark and make sure that you can see: Any grab bars or handrails. First and last steps. Where the edge of each step is. Use tools that help you move around (mobility aids) if they are needed. These include: Canes. Walkers. Scooters. Crutches. Turn on the lights when you go into a dark area. Replace any light bulbs as soon as they burn out. Set up your furniture so you have a clear path. Avoid moving your furniture around. If any of your floors are uneven, fix them. If there are any pets around you, be aware of where they are. Review your medicines with your doctor. Some medicines can make you feel dizzy. This can increase your chance of falling. Ask your doctor what other things that you can do to help prevent falls. This information is not intended to replace advice given to you by your health care provider. Make sure you discuss any questions you have with your health care provider. Document Released: 01/24/2009 Document Revised: 09/05/2015 Document Reviewed: 05/04/2014 Elsevier Interactive Patient Education  2017 [employer].

## 2022-08-04 ENCOUNTER — Other Ambulatory Visit: Payer: Self-pay | Admitting: Cardiovascular Disease

## 2022-08-04 NOTE — Telephone Encounter (Signed)
Pt's medication was sent to pt's pharmacy as requested. Confirmation received.  °

## 2022-09-15 ENCOUNTER — Ambulatory Visit (INDEPENDENT_AMBULATORY_CARE_PROVIDER_SITE_OTHER): Payer: Medicare Other | Admitting: Podiatry

## 2022-09-15 ENCOUNTER — Encounter: Payer: Self-pay | Admitting: Podiatry

## 2022-09-15 VITALS — BP 119/68

## 2022-09-15 DIAGNOSIS — B351 Tinea unguium: Secondary | ICD-10-CM | POA: Diagnosis not present

## 2022-09-15 DIAGNOSIS — M79675 Pain in left toe(s): Secondary | ICD-10-CM

## 2022-09-15 DIAGNOSIS — M79674 Pain in right toe(s): Secondary | ICD-10-CM | POA: Diagnosis not present

## 2022-09-20 NOTE — Progress Notes (Signed)
  Subjective:  Patient ID: Katelyn Estes, female    DOB: [birth year],  MRN: [id number]  Katelyn Estes presents to clinic today for painful thick toenails that are difficult to trim. Pain interferes with ambulation. Aggravating factors include wearing enclosed shoe gear. Pain is relieved with periodic professional debridement. Patient relates she has swelling in both legs. States she was sent to a clinic, but was told she was sent to the wrong clinic. Chief Complaint  Patient presents with   Nail Problem    RFC,Referring Provider Ardith Dark, MD,LOV:01/24    New problem(s): None.   PCP is Ardith Dark, MD.  No Known Allergies  Review of Systems: Negative except as noted in the HPI. Objective:   Constitutional OLETHA TOLSON is a pleasant 89 y.o. female, in NAD. AAO x 3.   Vascular Vascular Examination: Capillary refill time immediate b/l. Vascular status intact b/l with palpable pedal pulses. Pedal hair absent b/l. Lymphedema b/l LE. No pain with calf compression b/l. Skin temperature gradient WNL b/l. No cyanosis or clubbing b/l.   Neurological Examination: Sensation grossly intact b/l with 10 gram monofilament. Vibratory sensation intact b/l.   Dermatological Examination: Pedal skin with normal turgor, texture and tone b/l.  No open wounds. No interdigital macerations.   Toenails 1-5 b/l thick, discolored, elongated with subungual debris and pain on dorsal palpation.   No hyperkeratotic nor porokeratotic lesions present on today's visit.  Musculoskeletal Examination: Normal muscle strength 5/5 to all lower extremity muscle groups bilaterally. No pain, crepitus or joint limitation noted with ROM b/l LE. No gross bony pedal deformities b/l. Patient ambulates independently without assistive aids.  Radiographs: None  Last A1c:      Latest Ref Rng & Units 04/15/2022    8:50 AM 10/15/2021    8:28 AM  Hemoglobin A1C  Hemoglobin-A1c 4.6 - 6.5 % 5.8  5.9      Assessment:    1. Pain due to onychomycosis of toenails of both feet    Plan:  Patient was evaluated and treated and all questions answered. Consent given for treatment as described below: -Discussed edema with patient. She was advised to contact PCP regarding referral for her lower extremity edema. -Patient to continue soft, supportive shoe gear daily. -Mycotic toenails 1-5 bilaterally were debrided in length and girth with sterile nail nippers and dremel without incident. -Patient/POA to call should there be question/concern in the interim.  Return in about 9 weeks (around 11/17/2022).  Freddie Breech, DPM

## 2022-09-29 LAB — AMB RESULTS CONSOLE CBG: Glucose: 102

## 2022-10-12 ENCOUNTER — Ambulatory Visit (INDEPENDENT_AMBULATORY_CARE_PROVIDER_SITE_OTHER): Payer: Medicare Other | Admitting: Family Medicine

## 2022-10-12 ENCOUNTER — Encounter: Payer: Self-pay | Admitting: Family Medicine

## 2022-10-12 VITALS — BP 120/72 | HR 62 | Temp 98.0°F | Ht 62.0 in | Wt 187.2 lb

## 2022-10-12 DIAGNOSIS — R739 Hyperglycemia, unspecified: Secondary | ICD-10-CM | POA: Diagnosis not present

## 2022-10-12 DIAGNOSIS — M810 Age-related osteoporosis without current pathological fracture: Secondary | ICD-10-CM | POA: Diagnosis not present

## 2022-10-12 DIAGNOSIS — E559 Vitamin D deficiency, unspecified: Secondary | ICD-10-CM | POA: Diagnosis not present

## 2022-10-12 DIAGNOSIS — I890 Lymphedema, not elsewhere classified: Secondary | ICD-10-CM

## 2022-10-12 DIAGNOSIS — I10 Essential (primary) hypertension: Secondary | ICD-10-CM

## 2022-10-12 DIAGNOSIS — H9190 Unspecified hearing loss, unspecified ear: Secondary | ICD-10-CM

## 2022-10-12 DIAGNOSIS — K5900 Constipation, unspecified: Secondary | ICD-10-CM

## 2022-10-12 DIAGNOSIS — E785 Hyperlipidemia, unspecified: Secondary | ICD-10-CM | POA: Diagnosis not present

## 2022-10-12 DIAGNOSIS — L299 Pruritus, unspecified: Secondary | ICD-10-CM

## 2022-10-12 LAB — LIPID PANEL
Cholesterol: 204 mg/dL — ABNORMAL HIGH (ref 0–200)
HDL: 78.8 mg/dL (ref >39.00)
LDL Cholesterol: 105 mg/dL — ABNORMAL HIGH (ref 0–99)
NonHDL: 125.44
Total CHOL/HDL Ratio: 3
Triglycerides: 100 mg/dL (ref 0.0–149.0)
VLDL: 20 mg/dL (ref 0.0–40.0)

## 2022-10-12 LAB — CBC
HCT: 42 % (ref 36.0–46.0)
Hemoglobin: 13.7 g/dL (ref 12.0–15.0)
MCHC: 32.5 g/dL (ref 30.0–36.0)
MCV: 97.6 fl (ref 78.0–100.0)
Platelets: 293 10*3/uL (ref 150.0–400.0)
RBC: 4.31 Mil/uL (ref 3.87–5.11)
RDW: 14.4 % (ref 11.5–15.5)
WBC: 4.9 10*3/uL (ref 4.0–10.5)

## 2022-10-12 LAB — COMPREHENSIVE METABOLIC PANEL
ALT: 13 U/L (ref 0–35)
AST: 19 U/L (ref 0–37)
Albumin: 4 g/dL (ref 3.5–5.2)
Alkaline Phosphatase: 50 U/L (ref 39–117)
BUN: 16 mg/dL (ref 6–23)
CO2: 29 mEq/L (ref 19–32)
Calcium: 9.9 mg/dL (ref 8.4–10.5)
Chloride: 106 mEq/L (ref 96–112)
Creatinine, Ser: 0.66 mg/dL (ref 0.40–1.20)
GFR: 77.47 mL/min (ref >60.00)
Glucose, Bld: 100 mg/dL — ABNORMAL HIGH (ref 70–99)
Potassium: 5.1 mEq/L (ref 3.5–5.1)
Sodium: 143 mEq/L (ref 135–145)
Total Bilirubin: 0.4 mg/dL (ref 0.2–1.2)
Total Protein: 6.3 g/dL (ref 6.0–8.3)

## 2022-10-12 LAB — HEMOGLOBIN A1C: Hgb A1c MFr Bld: 5.8 % (ref 4.6–6.5)

## 2022-10-12 LAB — VITAMIN D 25 HYDROXY (VIT D DEFICIENCY, FRACTURES): VITD: 42.78 ng/mL (ref 30.00–100.00)

## 2022-10-12 LAB — TSH: TSH: 3.19 u[IU]/mL (ref 0.35–5.50)

## 2022-10-12 NOTE — Assessment & Plan Note (Signed)
Blood pressure at goal today without medications.  Check labs.

## 2022-10-12 NOTE — Progress Notes (Signed)
   MICKIE ZIEBELL is a 89 y.o. female who presents today for an office visit.  Assessment/Plan:  Chronic Problems Addressed Today: Essential hypertension Blood pressure at goal today without medications.  Check labs.  Dyslipidemia Check lipids.  She would like to avoid medications if possible.  Hyperglycemia Check A1c.  Constipation Check labs discussed fiber supplementation with Benefiber.  Encouraged hydration.  Consider GI referral if not improving.  Lymphedema Manages conservatively.  Uses compression garments and SCDs at home.  Will place referral to physical therapy for further management.  Osteoporosis Cannot tolerate fall smacks due to side effects.  She will continue with calcium and vitamin D supplementation.  We can repeat bone density scan in a couple of years.  Pruritus Continue management per dermatology.  Vitamin D deficiency Check vitamin D.  Decreased hearing No cerumen impaction on exam today.  She does have likely presbycusis and will be seeing audiology later this week to discuss possible hearing aids.     Subjective:  HPI:  See Assessment / plan for status of chronic conditions.    She was here a few months ago. We discussed her osteoporosis at that time. We did start fosamax but she stopped due to side effects. She has been taking her calcium and vitamin D.  She is having some ongoing issues with lymphedema.  She would like to be referred to a specialist for this.  Also having some intermittent issues with constipation.  Not currently taking anything for this.  She has been having some issues with decreased hearing.  She did have cerumen impaction several months ago that we irrigated here.  She is scheduled to see audiology later this week for possible hearing aid placement.       Objective:  Physical Exam: BP 120/72 (BP Location: Left Arm, Patient Position: Sitting, Cuff Size: Large)   Pulse 62   Temp 98 F (36.7 C) (Temporal)   Ht 5\' 2"   (1.575 m)   Wt 187 lb 4 oz (84.9 kg)   LMP  (LMP Unknown)   SpO2 97%   BMI 34.25 kg/m   Gen: No acute distress, resting comfortably HEENT: Bilateral EAC with small amount of cerumen bilaterally. CV: Regular rate and rhythm with no murmurs appreciated Pulm: Normal work of breathing, clear to auscultation bilaterally with no crackles, wheezes, or rhonchi Neuro: Grossly normal, moves all extremities Psych: Normal affect and thought content      Keani Gotcher M. Jimmey Ralph, MD 10/12/2022 8:55 AM

## 2022-10-12 NOTE — Patient Instructions (Signed)
It was very nice to see you today!  We will check blood work today.  Please try taking Benefiber for your constipation.  I will refer you to the lymphedema specialist.  Return in about 6 months (around 04/14/2023) for Follow Up.   Take care, Dr Jimmey Ralph  PLEASE NOTE:  If you had any lab tests, please let us know if you have not heard back within a few days. You may see your results on mychart before we have a chance to review them but we will give you a call once they are reviewed by [place].   If we ordered any referrals today, please let us know if you have not heard from their office within the next week.   If you had any urgent prescriptions sent in today, please check with the pharmacy within an hour of our visit to make sure the prescription was transmitted appropriately.   Please try these tips to maintain a healthy lifestyle:  Eat at least 3 REAL meals and 1-2 snacks per day.  Aim for no more than 5 hours between eating.  If you eat breakfast, please do so within one hour of getting up.   Each meal should contain half fruits/vegetables, one quarter protein, and one quarter carbs (no bigger than a computer mouse)  Cut down on sweet beverages. This includes juice, soda, and sweet tea.   Drink at least 1 glass of water with each meal and aim for at least 8 glasses per day  Exercise at least 150 minutes every week.

## 2022-10-12 NOTE — Assessment & Plan Note (Signed)
Manages conservatively.  Uses compression garments and SCDs at home.  Will place referral to physical therapy for further management.

## 2022-10-12 NOTE — Assessment & Plan Note (Signed)
Check vitamin D. 

## 2022-10-12 NOTE — Assessment & Plan Note (Signed)
Check A1c. 

## 2022-10-12 NOTE — Assessment & Plan Note (Signed)
No cerumen impaction on exam today.  She does have likely presbycusis and will be seeing audiology later this week to discuss possible hearing aids.

## 2022-10-12 NOTE — Assessment & Plan Note (Signed)
Cannot tolerate fall smacks due to side effects.  She will continue with calcium and vitamin D supplementation.  We can repeat bone density scan in a couple of years.

## 2022-10-12 NOTE — Assessment & Plan Note (Signed)
Check lipids.  She would like to avoid medications if possible.

## 2022-10-12 NOTE — Assessment & Plan Note (Signed)
Check labs discussed fiber supplementation with Benefiber.  Encouraged hydration.  Consider GI referral if not improving.

## 2022-10-12 NOTE — Assessment & Plan Note (Signed)
Continue management per dermatology.  

## 2022-10-20 NOTE — Progress Notes (Signed)
Great news! Her labs are all stable.  Do not need to make any changes to her treatment plan.  She should continue to work on diet and exercise and we can recheck everything in a year or so.

## 2022-10-27 IMAGING — MG MM DIGITAL SCREENING BILAT W/ TOMO AND CAD
8 series · 8 of 24 positions shown · non-contrast
Comparison: Previous exam(s).

CLINICAL DATA: Screening.

EXAM:
DIGITAL SCREENING BILATERAL MAMMOGRAM WITH TOMOSYNTHESIS AND CAD
TECHNIQUE: Bilateral screening digital craniocaudal and mediolateral oblique
mammograms were obtained. Bilateral screening digital breast
tomosynthesis was performed. The images were evaluated with
computer-aided detection.

[R CC synth-2D]
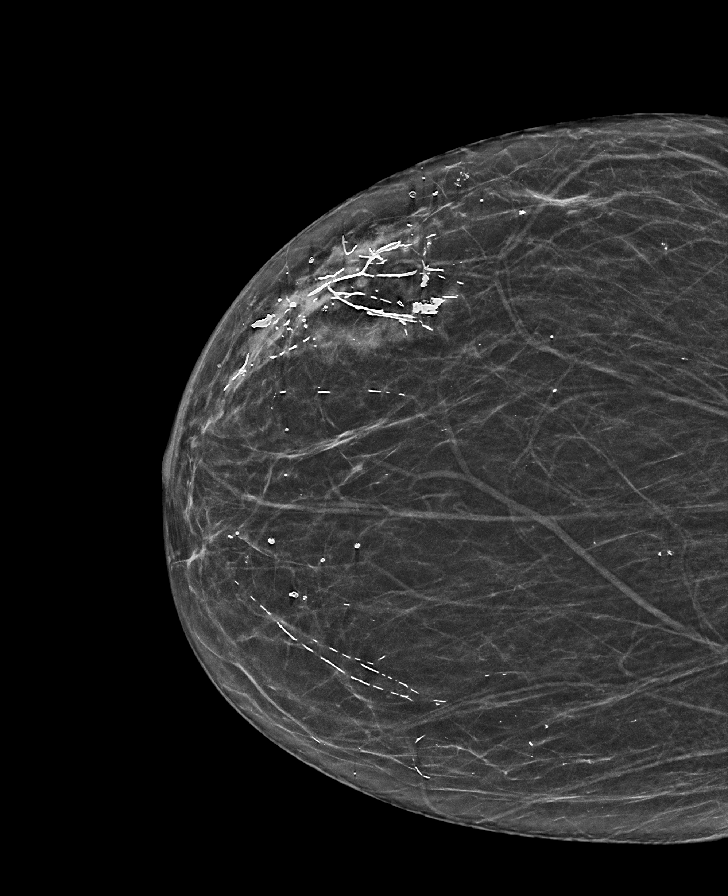

[R MLO synth-2D]
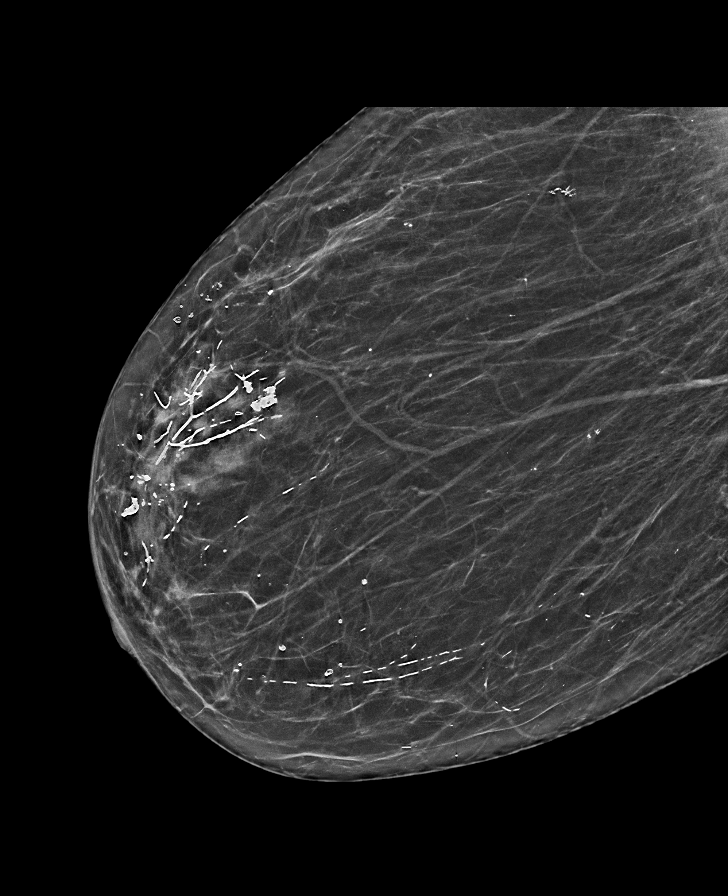

[L MLO synth-2D]
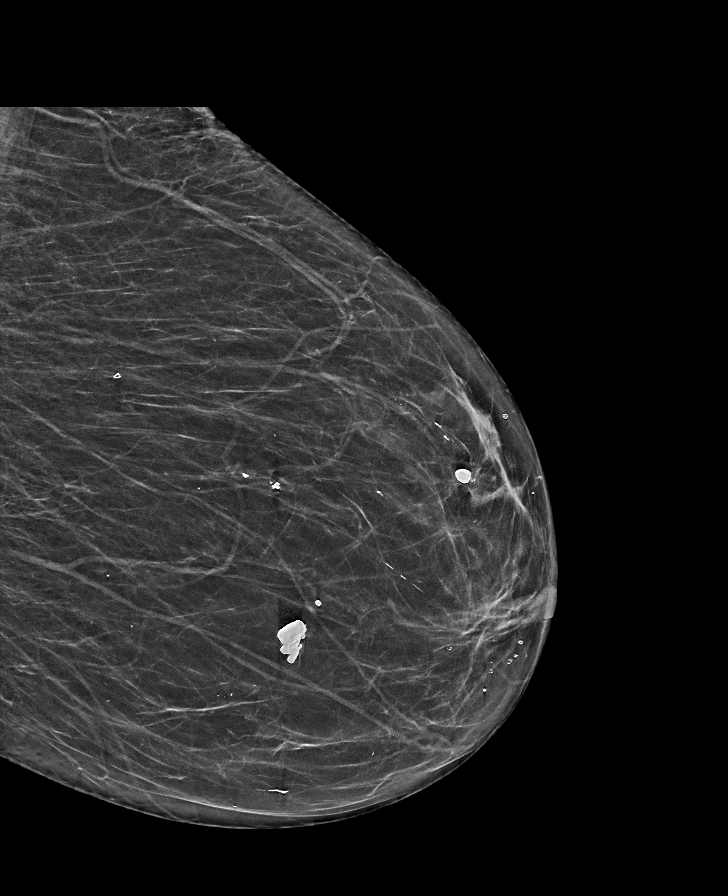

[L CC synth-2D]
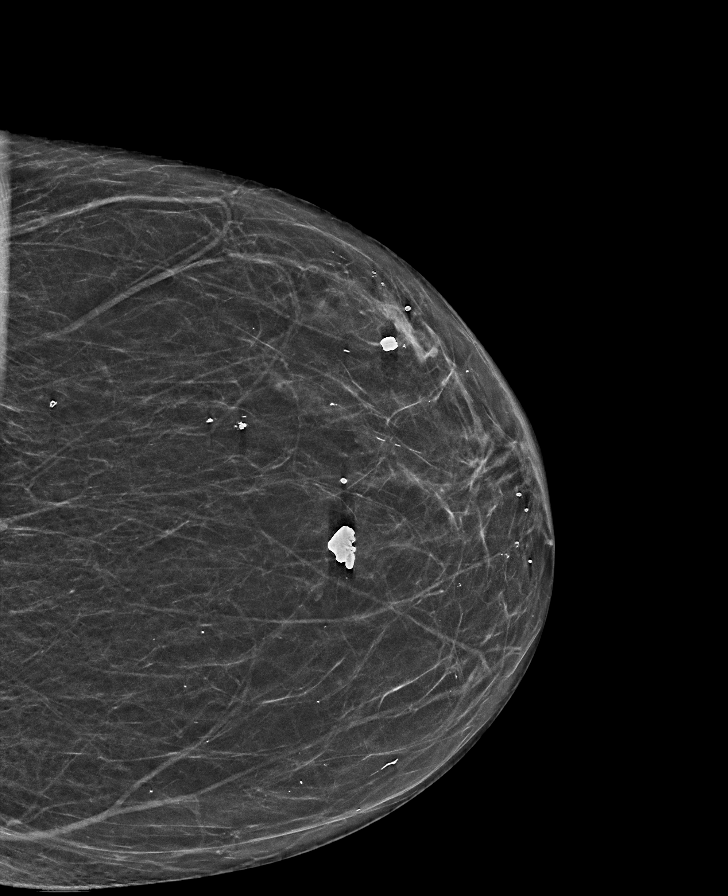

[R CC tomo · tomo slice 29/56.0]
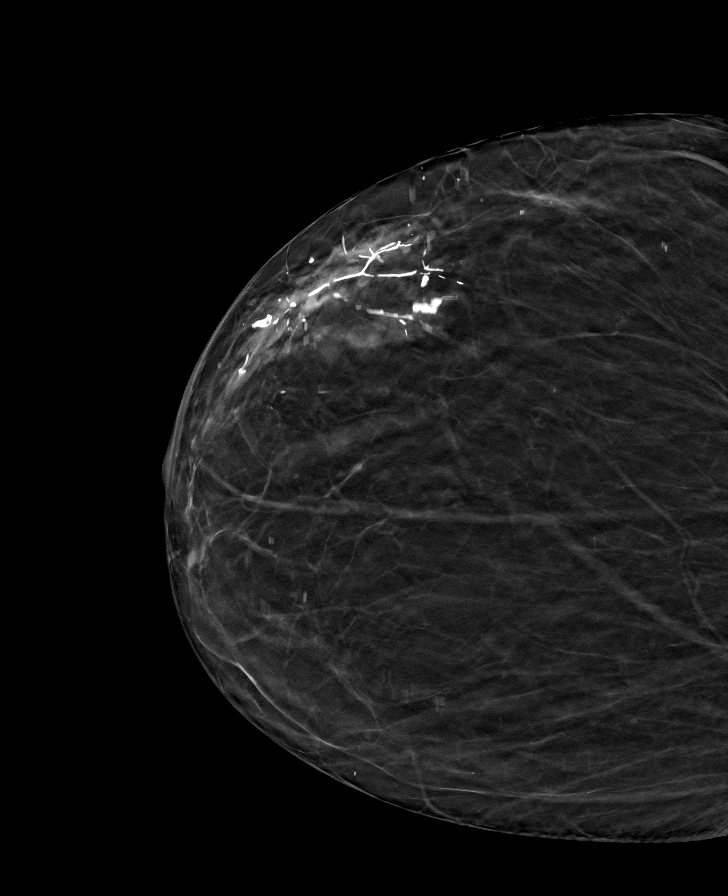

[L CC tomo · tomo slice 28/55.0]
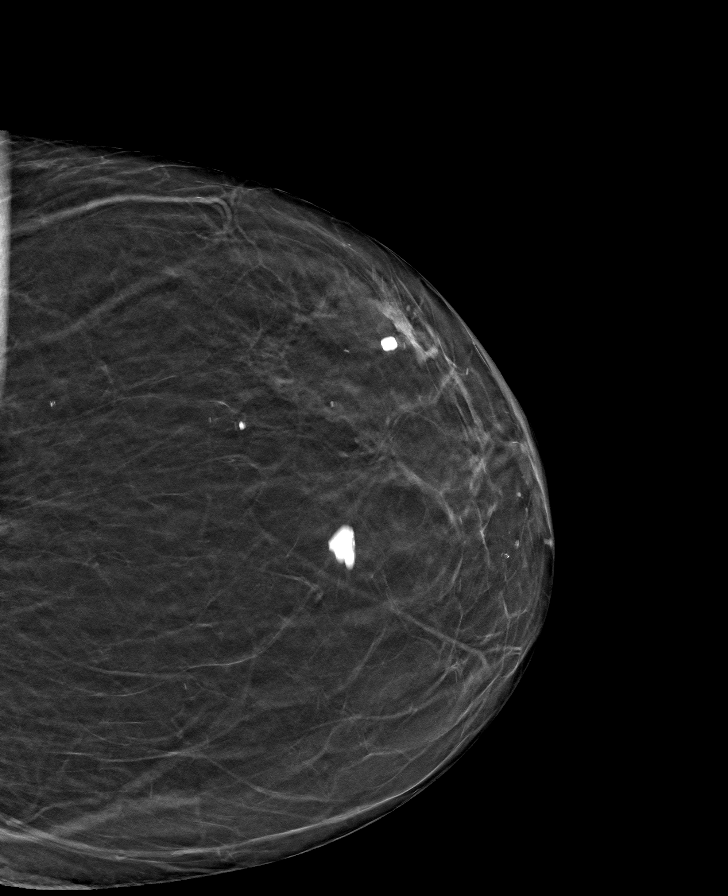

[R MLO tomo · tomo slice 32/63.0]
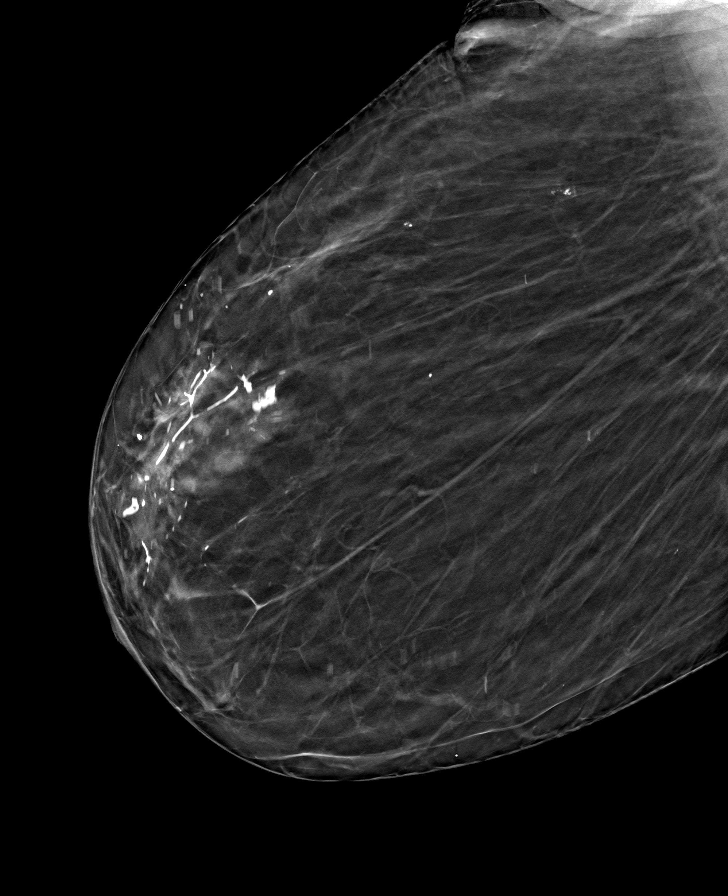

[L MLO tomo · tomo slice 31/60.0]
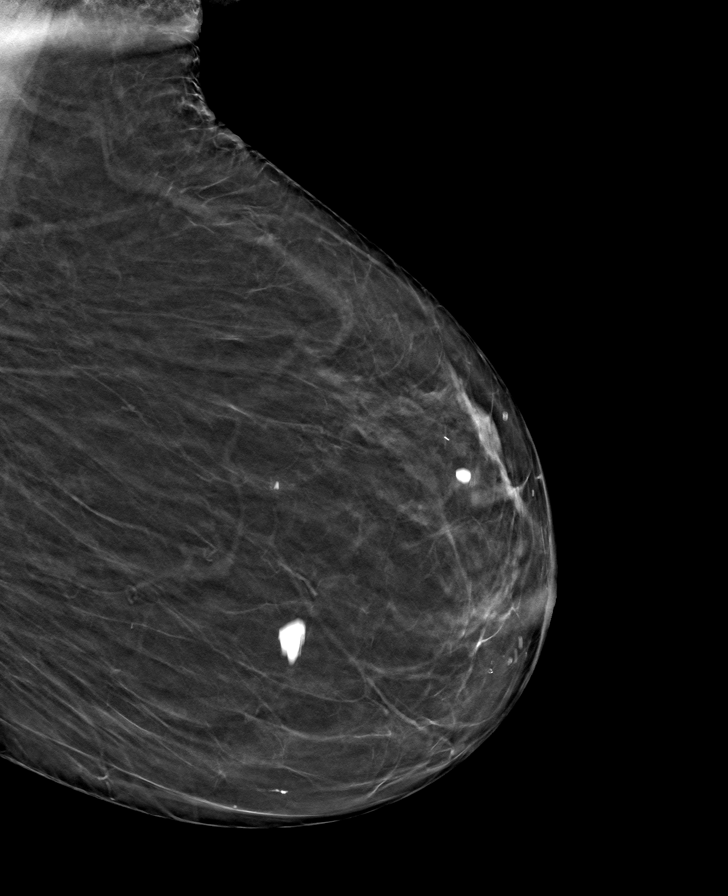

[8 of 24 positions shown; findings below may reference images not displayed]

ACR Breast Density Category b: There are scattered areas of
fibroglandular density.
FINDINGS: There are no findings suspicious for malignancy.
IMPRESSION: No mammographic evidence of malignancy. A result letter of this
screening mammogram will be mailed directly to the patient.

RECOMMENDATION:
Screening mammogram in one year. (Code:51-O-LD2)

BI-RADS CATEGORY  1: Negative.

## 2022-11-24 ENCOUNTER — Ambulatory Visit: Payer: Medicare Other | Admitting: Podiatry

## 2022-11-24 ENCOUNTER — Encounter: Payer: Self-pay | Admitting: Podiatry

## 2022-11-24 DIAGNOSIS — B351 Tinea unguium: Secondary | ICD-10-CM

## 2022-11-24 DIAGNOSIS — M79674 Pain in right toe(s): Secondary | ICD-10-CM | POA: Diagnosis not present

## 2022-11-24 DIAGNOSIS — M79675 Pain in left toe(s): Secondary | ICD-10-CM | POA: Diagnosis not present

## 2022-11-24 NOTE — Progress Notes (Signed)
CARDIOLOGY CONSULT NOTE       Patient ID: Katelyn Estes MRN: [id number] DOB/AGE: 87/27/1932 87 y.o.  Admit date: (Not on file) Referring Physician: Jimmey Ralph Primary Physician: Ardith Dark, MD Primary Cardiologist: Eden Emms Reason for Consultation: CAD/Lymphedema    HPI:  89 y.o. referred by Dr Jimmey Ralph for CAD. First seen by me 02/18/21 She has chronic lymphedema using pump since 2016 and has seen Dr Lajoyce Corners , wound center and oncology lymphedema center Reviewed [facility] from [facility] Heart History of stent to mid LAD.  Last cath 2017 with patent stent and moderate dx in proximal LAD and proximal RCA. FFR negative stent patent March 2021 lexiscan myovue normal Echo's have shown normal EF and trace MR. Chronic LE edema, dyspnea and atypical sharp chest pains better with Ranexa Tends to be bradycardic previous monitor with no AV block and self limited SVT. She is overweight and has OSA with recent use of CPAP  No angina Some trigeminal neuralgia and Insomnia   Use to work for [employer] in [employer] Widowed over 45 years Has 4 daughters 3 in [place] And moved to town home here. Having trouble getting socially integrated and finding People to play cards with   Lethargy with coreg now on atenolol    She does not like her new lymph machine Too cumbersome She lives at the [place] a few houses down from my father in law  Going to PT for her multiple shoulder, leg, back pains Voltaren helps   Needs to be on ASA not taking plavix now Has some neuropathy in legs Thinks lymphedema worse not on diuretic   ROS All other systems reviewed and negative except as noted above  Past Medical History:  Diagnosis Date   Arthritis    Cataracts, bilateral    Congestive heart disease (HCC)     History reviewed. No pertinent family history.  Social History   Socioeconomic History   Marital status: Widowed    Spouse name: Not on file   Number of children: Not on file   Years of education: Not on file   Highest  education level: Not on file  Occupational History   Not on file  Tobacco Use   Smoking status: Never   Smokeless tobacco: Never  Substance and Sexual Activity   Alcohol use: Yes    Comment: occ   Drug use: Never   Sexual activity: Not on file  Other Topics Concern   Not on file  Social History Narrative   Not on file   Social Determinants of Health   Financial Resource Strain: Low Risk  (07/20/2022)   Overall Financial Resource Strain (CARDIA)    Difficulty of Paying Living Expenses: Not hard at all  Food Insecurity: No Food Insecurity (07/20/2022)   Hunger Vital Sign    Worried About Running Out of Food in the Last Year: Never true    Ran Out of Food in the Last Year: Never true  Transportation Needs: No Transportation Needs (07/20/2022)   PRAPARE - Administrator, Civil Service (Medical): No    Lack of Transportation (Non-Medical): No  Physical Activity: Insufficiently Active (07/20/2022)   Exercise Vital Sign    Days of Exercise per Week: 1 day    Minutes of Exercise per Session: 50 min  Stress: No Stress Concern Present (07/20/2022)   [employer] of Occupational Health - Occupational Stress Questionnaire    Feeling of Stress : Not at all  Social Connections: Moderately Integrated (07/20/2022)  Social Advertising account executive [NHANES]    Frequency of Communication with Friends and Family: More than three times a week    Frequency of Social Gatherings with Friends and Family: More than three times a week    Attends Religious Services: More than 4 times per year    Active Member of [employer] or Organizations: Yes    Attends Banker Meetings: 1 to 4 times per year    Marital Status: Widowed  Intimate Partner Violence: Not At Risk (07/20/2022)   Humiliation, Afraid, Rape, and Kick questionnaire    Fear of Current or Ex-Partner: No    Emotionally Abused: No    Physically Abused: No    Sexually Abused: No    Past Surgical History:  Procedure  Laterality Date   BACK SURGERY  2005   REPLACEMENT TOTAL KNEE BILATERAL  2003   TONSILLECTOMY  1939      Current Outpatient Medications:    ammonium lactate (LAC-HYDRIN) 12 % lotion, Apply to both feet up to twice daily. Avoid application between toes., Disp: 400 g, Rfl: 5   Calcium Carbonate (CALCIUM 500 PO), Take by mouth., Disp: , Rfl:    Cyanocobalamin (VITAMIN B 12 PO), Take 1 tablet by mouth daily., Disp: , Rfl:    Multiple Vitamin (MULTIVITAMIN) tablet, Take 1 tablet by mouth daily., Disp: , Rfl:    nitroGLYCERIN (NITROSTAT) 0.4 MG SL tablet, PLACE 1 TABLET UNDER THE TONGUE EVERY 5 MINUTES AS NEEDED FOR CHEST PAIN., Disp: 25 tablet, Rfl: 4   triamcinolone (KENALOG) 0.025 % cream, Apply 1 Application topically 2 (two) times daily., Disp: , Rfl:    VITAMIN D, CHOLECALCIFEROL, PO, Take 1 tablet by mouth daily., Disp: , Rfl:     Physical Exam: Blood pressure 118/78, pulse 80, height 5\' 1"  (1.549 m), weight 188 lb 3.2 oz (85.4 kg), SpO2 96%.    Affect appropriate Overweight elderly female  HEENT: normal Neck supple with no adenopathy JVP normal no bruits no thyromegaly Lungs clear with no wheezing and good diaphragmatic motion Heart:  S1/S2 no murmur, no rub, gallop or click PMI normal Abdomen: benighn, BS positve, no tenderness, no AAA no bruit.  No HSM or HJR Distal pulses intact with no bruits Bilateral lymphedema prominent over feet Post bilateral TKR;s  Neuro non-focal Skin warm and dry No muscular weakness   Labs:   Lab Results  Component Value Date   WBC 4.9 10/12/2022   HGB 13.7 10/12/2022   HCT 42.0 10/12/2022   MCV 97.6 10/12/2022   PLT 293.0 10/12/2022   No results for input(s): "NA", "K", "CL", "CO2", "BUN", "CREATININE", "CALCIUM", "PROT", "BILITOT", "ALKPHOS", "ALT", "AST", "GLUCOSE" in the last 168 hours.  Invalid input(s): "LABALBU" No results found for: "CKTOTAL", "CKMB", "CKMBINDEX", "TROPONINI"  Lab Results  Component Value Date   CHOL 204  (H) 10/12/2022   CHOL 202 (H) 04/15/2022   CHOL 202 (H) 10/15/2021   Lab Results  Component Value Date   HDL 78.80 10/12/2022   HDL 86.60 04/15/2022   HDL 79.50 10/15/2021   Lab Results  Component Value Date   LDLCALC 105 (H) 10/12/2022   LDLCALC 91 04/15/2022   LDLCALC 103 (H) 10/15/2021   Lab Results  Component Value Date   TRIG 100.0 10/12/2022   TRIG 122.0 04/15/2022   TRIG 98.0 10/15/2021   Lab Results  Component Value Date   CHOLHDL 3 10/12/2022   CHOLHDL 2 04/15/2022   CHOLHDL 3 10/15/2021   No results found  for: "LDLDIRECT"    Radiology: No results found.  EKG: SR rate 85 LAD no change 12/03/2022    ASSESSMENT AND PLAN:   CAD: stent to mid LAD with non ischemic myovue March 2021 Historically on plavix monotherapy but now not taking. Start ASA 81 mg continue beta blocker  Lymphedema:  Start lasix 20 mg daily sees lymphedema clinic has pump will f/u with wound center BMET in 4 weeks  OSA:  discussed weight loss and CPAP Bradycardia:  history of Tachycardic today see above regarding low dose atenolol  HLD:  LDL 105  f/u primary defers statin given age    ASA 81 mg Lasix 20 mg BMET 4 weeks  F/U in a year    Signed: Charlton Haws 12/03/2022, 9:17 AM

## 2022-11-24 NOTE — Progress Notes (Signed)
  Subjective:  Patient ID: Katelyn Estes, female    DOB: Sep 03, 1930,  MRN: [id number]  89 y.o. female presents to clinic with painful elongated mycotic toenails 1-5 bilaterally which are tender when wearing enclosed shoe gear. Pain is relieved with periodic professional debridement. Chief Complaint  Patient presents with   RFC    PCP Jacquiline Doe MD LOV 10/12/22   Lymphedema    BLE, R>L    New problem(s): None   PCP is Ardith Dark, MD.  No Known Allergies  Review of Systems: Negative except as noted in the HPI.   Objective:  Katelyn Estes is a pleasant 89 y.o. female WD, WN in NAD.Marland Kitchen  Vascular Examination: Vascular status intact b/l with palpable pedal pulses. CFT immediate b/l. No pain with calf compression b/l. Skin temperature gradient WNL b/l. Pedal hair absent. Lymphedema present BLE.  Neurological Examination: Sensation grossly intact b/l with 10 gram monofilament. Vibratory sensation intact b/l.   Dermatological Examination: Pedal skin with normal turgor, texture and tone b/l. Toenails 1-5 b/l thick, discolored, elongated with subungual debris and pain on dorsal palpation. No hyperkeratotic lesions noted b/l.   Musculoskeletal Examination: Muscle strength 5/5 to b/l LE. No pain, crepitus or joint limitation noted with ROM bilateral LE. No gross bony deformities bilaterally. Uses walking stick for ambulation assistance.  Radiographs: None  Last A1c:      Latest Ref Rng & Units 10/12/2022    9:01 AM 04/15/2022    8:50 AM  Hemoglobin A1C  Hemoglobin-A1c 4.6 - 6.5 % 5.8  5.8      Assessment:  No diagnosis found.  Plan:  Patient was evaluated and treated. All patient's and/or POA's questions/concerns addressed on today's visit. Toenails 1-5 debrided in length and girth without incident. Continue soft, supportive shoe gear daily. Report any pedal injuries to medical professional. Call office if there are any questions/concerns. -Patient/POA to call should there be  question/concern in the interim.  Return in about 3 months (around 02/24/2023).  Freddie Breech, DPM

## 2022-12-03 ENCOUNTER — Ambulatory Visit: Payer: Medicare Other | Attending: Cardiovascular Disease | Admitting: Cardiovascular Disease

## 2022-12-03 ENCOUNTER — Encounter: Payer: Self-pay | Admitting: Cardiovascular Disease

## 2022-12-03 VITALS — BP 118/78 | HR 80 | Ht 61.0 in | Wt 188.2 lb

## 2022-12-03 DIAGNOSIS — I25119 Atherosclerotic heart disease of native coronary artery with unspecified angina pectoris: Secondary | ICD-10-CM | POA: Diagnosis present

## 2022-12-03 DIAGNOSIS — I890 Lymphedema, not elsewhere classified: Secondary | ICD-10-CM | POA: Diagnosis present

## 2022-12-03 DIAGNOSIS — E782 Mixed hyperlipidemia: Secondary | ICD-10-CM

## 2022-12-03 MED ORDER — ASPIRIN 81 MG PO TBEC: 81.0000 mg | DELAYED_RELEASE_TABLET | Freq: Every day | ORAL | Status: AC

## 2022-12-03 MED ORDER — FUROSEMIDE 20 MG PO TABS: 20.0000 mg | ORAL_TABLET | Freq: Every day | ORAL | 3 refills | Status: DC

## 2022-12-03 NOTE — Patient Instructions (Addendum)
Medication Instructions:  Your physician has recommended you make the following change in your medication:  1-START Furosemide 20 mg by mouth daily.  2-TAKE Aspirin 81 mg by mouth daily.  *If you need a refill on your cardiac medications before your next appointment, please call your pharmacy*  Lab Work: Your physician recommends that you return for lab work in: 4 weeks for BMET  If you have labs (blood work) drawn today and your tests are completely normal, you will receive your results only by: MyChart Message (if you have MyChart) OR A paper copy in the mail If you have any lab test that is abnormal or we need to change your treatment, we will call you to review the results.  Testing/Procedures: None ordered today.  Follow-Up: At [facility], you and your health needs are our priority.  As part of our continuing mission to provide you with exceptional heart care, we have created designated Provider Care Teams.  These Care Teams include your primary Cardiologist (physician) and Advanced Practice Providers (APPs -  Physician Assistants and Nurse Practitioners) who all work together to provide you with the care you need, when you need it.  We recommend signing up for the patient portal called "MyChart".  Sign up information is provided on this After Visit Summary.  MyChart is used to connect with patients for Virtual Visits (Telemedicine).  Patients are able to view lab/test results, encounter notes, upcoming appointments, etc.  Non-urgent messages can be sent to your provider as well.   To learn more about what you can do with MyChart, go to [email or web address].    Your next appointment:   1 year(s)  Provider:   Charlton Haws, MD

## 2022-12-04 ENCOUNTER — Ambulatory Visit: Payer: Medicare Other | Admitting: Cardiovascular Disease

## 2022-12-28 ENCOUNTER — Telehealth: Payer: Self-pay | Admitting: Family Medicine

## 2022-12-28 ENCOUNTER — Other Ambulatory Visit: Payer: Self-pay | Admitting: *Deleted

## 2022-12-28 DIAGNOSIS — I10 Essential (primary) hypertension: Secondary | ICD-10-CM

## 2022-12-28 NOTE — Telephone Encounter (Signed)
The PT in the chart was put internally but it needs to be sent to [facility] - [phone] - Fax [phone] - patient has an appt soon and this needs to be sent asap.Please advise.

## 2022-12-28 NOTE — Telephone Encounter (Signed)
New referral placed.

## 2022-12-28 NOTE — Progress Notes (Signed)
pt

## 2022-12-31 ENCOUNTER — Ambulatory Visit: Payer: Medicare Other | Attending: Cardiovascular Disease

## 2022-12-31 DIAGNOSIS — I25119 Atherosclerotic heart disease of native coronary artery with unspecified angina pectoris: Secondary | ICD-10-CM

## 2023-01-01 LAB — BASIC METABOLIC PANEL
BUN/Creatinine Ratio: 18 (ref 12–28)
BUN: 12 mg/dL (ref 10–36)
CO2: 25 mmol/L (ref 20–29)
Calcium: 9.5 mg/dL (ref 8.7–10.3)
Chloride: 106 mmol/L (ref 96–106)
Creatinine, Ser: 0.67 mg/dL (ref 0.57–1.00)
Glucose: 94 mg/dL (ref 70–99)
Potassium: 4.3 mmol/L (ref 3.5–5.2)
Sodium: 147 mmol/L — ABNORMAL HIGH (ref 134–144)
eGFR: 83 mL/min/{1.73_m2} (ref >59)

## 2023-01-26 ENCOUNTER — Ambulatory Visit (INDEPENDENT_AMBULATORY_CARE_PROVIDER_SITE_OTHER): Payer: Medicare Other | Admitting: Podiatry

## 2023-01-26 ENCOUNTER — Encounter: Payer: Self-pay | Admitting: Podiatry

## 2023-01-26 ENCOUNTER — Ambulatory Visit: Payer: Medicare Other | Admitting: Podiatry

## 2023-01-26 DIAGNOSIS — M79674 Pain in right toe(s): Secondary | ICD-10-CM

## 2023-01-26 DIAGNOSIS — M79675 Pain in left toe(s): Secondary | ICD-10-CM

## 2023-01-26 DIAGNOSIS — B351 Tinea unguium: Secondary | ICD-10-CM | POA: Diagnosis not present

## 2023-01-26 NOTE — Progress Notes (Signed)
  Subjective:  Patient ID: Katelyn Estes, female    DOB: 04/14/1930,  MRN: [id number]  89 y.o. female presents to clinic with painful elongated mycotic toenails 1-5 bilaterally which are tender when wearing enclosed shoe gear. Pain is relieved with periodic professional debridement. Chief Complaint  Patient presents with   RFC    RFC    New problem(s): None   PCP is Ardith Dark, MD.  No Known Allergies  Review of Systems: Negative except as noted in the HPI.   Objective:  Katelyn Estes is a pleasant 89 y.o. female WD, WN in NAD.Marland Kitchen  Vascular Examination: Vascular status intact b/l with palpable pedal pulses. CFT immediate b/l. No pain with calf compression b/l. Skin temperature gradient WNL b/l. Pedal hair absent. Lymphedema present BLE.  Neurological Examination: Sensation grossly intact b/l with 10 gram monofilament. Vibratory sensation intact b/l.   Dermatological Examination: Pedal skin with normal turgor, texture and tone b/l. Toenails 1-5 b/l thick, discolored, elongated with subungual debris and pain on dorsal palpation. No hyperkeratotic lesions noted b/l.   Musculoskeletal Examination: Muscle strength 5/5 to b/l LE. No pain, crepitus or joint limitation noted with ROM bilateral LE. No gross bony deformities bilaterally. Uses walking stick for ambulation assistance.  Radiographs: None  Last A1c:      Latest Ref Rng & Units 10/12/2022    9:01 AM 04/15/2022    8:50 AM  Hemoglobin A1C  Hemoglobin-A1c 4.6 - 6.5 % 5.8  5.8      Assessment:   1. Pain due to onychomycosis of toenails of both feet     Plan:  Patient was evaluated and treated. All patient's and/or POA's questions/concerns addressed on today's visit. Toenails 1-5 debrided in length and girth without incident. Continue soft, supportive shoe gear daily. Report any pedal injuries to medical professional. Call office if there are any questions/concerns. -Patient/POA to call should there be question/concern  in the interim.  Return in about 9 weeks (around 03/30/2023) for Routine foot Care.  Barbaraann Share, DPM

## 2023-03-30 ENCOUNTER — Ambulatory Visit: Payer: Medicare Other | Admitting: Podiatry

## 2023-03-30 ENCOUNTER — Encounter: Payer: Self-pay | Admitting: Podiatry

## 2023-03-30 DIAGNOSIS — B351 Tinea unguium: Secondary | ICD-10-CM

## 2023-03-30 DIAGNOSIS — M79675 Pain in left toe(s): Secondary | ICD-10-CM | POA: Diagnosis not present

## 2023-03-30 DIAGNOSIS — M79674 Pain in right toe(s): Secondary | ICD-10-CM

## 2023-03-30 NOTE — Progress Notes (Signed)
  Subjective:  Patient ID: Katelyn Estes, female    DOB: 30-May-1930,  MRN: [id number]  89 y.o. female presents painful thick toenails that are difficult to trim. Pain interferes with ambulation. Aggravating factors include wearing enclosed shoe gear. Pain is relieved with periodic professional debridement. Chief Complaint  Patient presents with   Nail Problem    Patient states she saw her pcp 5 months ago    New problem(s): None   PCP is Ardith Dark, MD.  No Known Allergies  Review of Systems: Negative except as noted in the HPI.   Objective:  Katelyn Estes is a pleasant 89 y.o. female in NAD. AAO x 3.  Vascular Examination: Vascular status intact b/l with palpable pedal pulses. CFT immediate b/l. No pain with calf compression b/l. Lymphedema present BLE.  Neurological Examination: Sensation grossly intact b/l with 10 gram monofilament. Vibratory sensation intact b/l.  Dermatological Examination: Pedal skin with normal turgor, texture and tone b/l. No open wounds nor interdigital macerations noted. Toenails 1-5 b/l thick, discolored, elongated with subungual debris and pain on dorsal palpation. No hyperkeratotic lesions noted b/l.   Musculoskeletal Examination: Muscle strength 5/5 to b/l LE.  No pain, crepitus noted b/l. No gross bony deformities bilaterally. Uses walking stick for ambulation assistance.  Radiographs: None  Last A1c:      Latest Ref Rng & Units 10/12/2022    9:01 AM 04/15/2022    8:50 AM  Hemoglobin A1C  Hemoglobin-A1c 4.6 - 6.5 % 5.8  5.8    Assessment:   1. Pain due to onychomycosis of toenails of both feet    Plan:  -Consent given for treatment as described below: -Examined patient. -Continue supportive shoe gear daily. -Mycotic toenails 1-5 bilaterally were debrided in length and girth with sterile nail nippers and dremel without incident. -Patient/POA to call should there be question/concern in the interim.  Return in about 3 months (around  06/28/2023).  Freddie Breech, DPM      [place] LOCATION: [address] [place] [address]                   Office [phone]   [facility] LOCATION: [address] [place], [place] [address] Office [phone]

## 2023-04-19 ENCOUNTER — Ambulatory Visit: Payer: Medicare Other | Admitting: Family Medicine

## 2023-04-22 ENCOUNTER — Encounter: Payer: Self-pay | Admitting: Family Medicine

## 2023-04-22 ENCOUNTER — Ambulatory Visit (INDEPENDENT_AMBULATORY_CARE_PROVIDER_SITE_OTHER): Payer: Medicare Other | Admitting: Family Medicine

## 2023-04-22 VITALS — BP 129/82 | HR 95 | Temp 97.7°F | Ht 61.0 in | Wt 191.8 lb

## 2023-04-22 DIAGNOSIS — E785 Hyperlipidemia, unspecified: Secondary | ICD-10-CM | POA: Diagnosis not present

## 2023-04-22 DIAGNOSIS — L299 Pruritus, unspecified: Secondary | ICD-10-CM | POA: Diagnosis not present

## 2023-04-22 DIAGNOSIS — K5900 Constipation, unspecified: Secondary | ICD-10-CM

## 2023-04-22 DIAGNOSIS — G4700 Insomnia, unspecified: Secondary | ICD-10-CM

## 2023-04-22 DIAGNOSIS — I10 Essential (primary) hypertension: Secondary | ICD-10-CM

## 2023-04-22 DIAGNOSIS — M1990 Unspecified osteoarthritis, unspecified site: Secondary | ICD-10-CM

## 2023-04-22 DIAGNOSIS — I25119 Atherosclerotic heart disease of native coronary artery with unspecified angina pectoris: Secondary | ICD-10-CM

## 2023-04-22 DIAGNOSIS — R739 Hyperglycemia, unspecified: Secondary | ICD-10-CM | POA: Diagnosis not present

## 2023-04-22 MED ORDER — CIPROFLOXACIN-DEXAMETHASONE 0.3-0.1 % OT SUSP: 4.0000 [drp] | Freq: Two times a day (BID) | OTIC | 0 refills | Status: DC

## 2023-04-22 MED ORDER — POLYETHYLENE GLYCOL 3350 17 GM/SCOOP PO POWD: 17.0000 g | Freq: Two times a day (BID) | ORAL | 1 refills | Status: DC | PRN

## 2023-04-22 NOTE — Progress Notes (Signed)
   SHELISHA GAUTIER is a 89 y.o. female who presents today for an office visit.  Assessment/Plan:  Chronic Problems Addressed Today: Pruritus No red flags.  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  She does have a dermatologist and will be seeing them soon however recommended that she try Sarna lotion.  Constipation Symptoms are not controlled.  She is straining with bowel movements and having small hard pebbles with any bowel movement.  We previously had recommended fiber supplementation however symptoms have not improved.  We will start her on MiraLAX  and refer her to GI per her request.    Essential hypertension Check labs.  Blood pressure at goal today without meds.  Hyperglycemia Check A1c.  Coronary artery disease Continue management per cardiology.   Osteoarthritis Overall symptoms are stable.  We did discuss referral to orthopedics however she declined.  She can use over-the-counter meds as needed.  She will let us  know if she changes her mind about referral.  Insomnia Symptoms are still not controlled.  She does admit to watching TV constantly and drinking a significant mount of caffeine.  We reiterated sleep hygiene measures.  She would like to avoid medications at this point however she can try melatonin as needed but do not think this will help much unless she improves her sleep hygiene.     Subjective:  HPI:  See Assessment / plan for status of chronic conditions. She is here today for 6 month follow up.  At her last visit she was doing well.  Overall seems to be doing well today though does have a few additional concerns that she would like to discuss today.  She is sometimes passing small hard pebbles with her bowel movements. Sometimes has to strain to push the stool out. Sometimes gets some blood in her stool but has not had this for awhile.  She does not think that she is constipated she is having a bowel movement every day.  She does have some abdominal pain and pressure  occasionally.  She is also having itchy skin.  This is scattered throughout arms scalp and torso.  She has used lotion in the past with some improvement.  She would like to have blood work done today.  She is also having more irritation and itching in her ear canals.  This has been well for a while.  No treatments tried.       Objective:  Physical Exam: BP 129/82   Pulse 95   Temp 97.7 F (36.5 C) (Temporal)   Ht 5' 1 (1.549 m)   Wt 191 lb 12.8 oz (87 kg)   LMP  (LMP Unknown)   SpO2 98%   BMI 36.24 kg/m   Gen: No acute distress, resting comfortably HEENT: TMs clear.  Bilateral EAC with erythema and flaking. CV: Regular rate and rhythm with no murmurs appreciated Pulm: Normal work of breathing, clear to auscultation bilaterally with no crackles, wheezes, or rhonchi Skin: Xerosis cutis noted throughout Neuro: Grossly normal, moves all extremities Psych: Normal affect and thought content      Shandell Jallow M. Kennyth, MD 04/22/2023 3:26 PM

## 2023-04-22 NOTE — Assessment & Plan Note (Signed)
Continue management per cardiology. 

## 2023-04-22 NOTE — Patient Instructions (Addendum)
 It was very nice to see you today!  Please start the miralax  for your constipation. I will refer you to see the gastroenterologist.   Please try the sarna to help with the itching.  Start the ear drops.  We will check blood work today.  I will see back in 6 months.  Return in about 6 months (around 10/20/2023).   Take care, Dr Kennyth  PLEASE NOTE:  If you had any lab tests, please let us  know if you have not heard back within a few days. You may see your results on mychart before we have a chance to review them but we will give you a call once they are reviewed by us .   If we ordered any referrals today, please let us  know if you have not heard from their office within the next week.   If you had any urgent prescriptions sent in today, please check with the pharmacy within an hour of our visit to make sure the prescription was transmitted appropriately.   Please try these tips to maintain a healthy lifestyle:  Eat at least 3 REAL meals and 1-2 snacks per day.  Aim for no more than 5 hours between eating.  If you eat breakfast, please do so within one hour of getting up.   Each meal should contain half fruits/vegetables, one quarter protein, and one quarter carbs (no bigger than a computer mouse)  Cut down on sweet beverages. This includes juice, soda, and sweet tea.   Drink at least 1 glass of water with each meal and aim for at least 8 glasses per day  Exercise at least 150 minutes every week.

## 2023-04-22 NOTE — Assessment & Plan Note (Signed)
 Symptoms are still not controlled.  She does admit to watching TV constantly and drinking a significant mount of caffeine.  We reiterated sleep hygiene measures.  She would like to avoid medications at this point however she can try melatonin as needed but do not think this will help much unless she improves her sleep hygiene.

## 2023-04-22 NOTE — Assessment & Plan Note (Signed)
 Symptoms are not controlled.  She is straining with bowel movements and having small hard pebbles with any bowel movement.  We previously had recommended fiber supplementation however symptoms have not improved.  We will start her on MiraLAX  and refer her to GI per her request.

## 2023-04-22 NOTE — Assessment & Plan Note (Signed)
 Overall symptoms are stable.  We did discuss referral to orthopedics however she declined.  She can use over-the-counter meds as needed.  She will let us know if she changes her mind about referral.

## 2023-04-22 NOTE — Assessment & Plan Note (Signed)
 No red flags.  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  She does have a dermatologist and will be seeing them soon however recommended that she try Sarna lotion.

## 2023-04-22 NOTE — Assessment & Plan Note (Signed)
 Check A1c.

## 2023-04-22 NOTE — Assessment & Plan Note (Signed)
 Check labs.  Blood pressure at goal today without meds.

## 2023-04-23 LAB — CBC
HCT: 41.1 % (ref 36.0–46.0)
Hemoglobin: 13.6 g/dL (ref 12.0–15.0)
MCHC: 33.1 g/dL (ref 30.0–36.0)
MCV: 97.7 fL (ref 78.0–100.0)
Platelets: 309 10*3/uL (ref 150.0–400.0)
RBC: 4.21 Mil/uL (ref 3.87–5.11)
RDW: 14.1 % (ref 11.5–15.5)
WBC: 6 10*3/uL (ref 4.0–10.5)

## 2023-04-23 LAB — TSH: TSH: 2.09 u[IU]/mL (ref 0.35–5.50)

## 2023-04-23 LAB — COMPREHENSIVE METABOLIC PANEL
ALT: 13 U/L (ref 0–35)
AST: 21 U/L (ref 0–37)
Albumin: 4.1 g/dL (ref 3.5–5.2)
Alkaline Phosphatase: 54 U/L (ref 39–117)
BUN: 18 mg/dL (ref 6–23)
CO2: 31 meq/L (ref 19–32)
Calcium: 9.7 mg/dL (ref 8.4–10.5)
Chloride: 103 meq/L (ref 96–112)
Creatinine, Ser: 0.63 mg/dL (ref 0.40–1.20)
GFR: 78.06 mL/min (ref >60.00)
Glucose, Bld: 79 mg/dL (ref 70–99)
Potassium: 4.4 meq/L (ref 3.5–5.1)
Sodium: 143 meq/L (ref 135–145)
Total Bilirubin: 0.4 mg/dL (ref 0.2–1.2)
Total Protein: 6.6 g/dL (ref 6.0–8.3)

## 2023-04-23 LAB — HEMOGLOBIN A1C: Hgb A1c MFr Bld: 6.1 % (ref 4.6–6.5)

## 2023-04-23 NOTE — Progress Notes (Signed)
 Labs are all stable.  We can recheck again in 6 months.

## 2023-05-03 ENCOUNTER — Ambulatory Visit: Payer: Medicare Other | Admitting: Gastroenterology

## 2023-05-31 ENCOUNTER — Ambulatory Visit
Admission: RE | Admit: 2023-05-31 | Discharge: 2023-05-31 | Disposition: A | Discharge status: Home / Self Care | Payer: Medicare Other | Source: Ambulatory Visit | Attending: Family Medicine | Admitting: Family Medicine

## 2023-05-31 DIAGNOSIS — Z1231 Encounter for screening mammogram for malignant neoplasm of breast: Secondary | ICD-10-CM

## 2023-05-31 NOTE — Progress Notes (Unsigned)
 Chief Complaint:constipation Primary GI Doctor:unassigned  HPI:  Patient is a  89  year old female patient with past medical history of CHF, arthritis, and cataracts*****who was referred to me by Ardith Dark, MD on 04/22/2023 for a complaint of constipation .    Interval History  Patient has history of constipation Patient taking Miralax po daily Patient admits/denies altered bowel habits Patient admits/denies abdominal pain Patient admits/denies rectal bleeding  Patient admits/denies GERD Patient admits/denies dysphagia Patient admits/denies nausea, vomiting, or weight loss    Denies/Admits alcohol Denies/Admits smoking Denies/Admits NSAID use. Denies/Admits they are on blood thinners.  Patients last colonoscopy Patients last EGD  Patient's family history includes  Wt Readings from Last 3 Encounters:  04/22/23 191 lb 12.8 oz (87 kg)  12/03/22 188 lb 3.2 oz (85.4 kg)  10/12/22 187 lb 4 oz (84.9 kg)      Past Medical History:  Diagnosis Date   Arthritis    Cataracts, bilateral    Congestive heart disease (HCC)     Past Surgical History:  Procedure Laterality Date   BACK SURGERY  2005   BREAST BIOPSY     30+/many years ago   REPLACEMENT TOTAL KNEE BILATERAL  2003   TONSILLECTOMY  1939    Current Outpatient Medications  Medication Sig Dispense Refill   ammonium lactate (LAC-HYDRIN) 12 % lotion Apply to both feet up to twice daily. Avoid application between toes. 400 g 5   aspirin EC 81 MG tablet Take 1 tablet (81 mg total) by mouth daily. Swallow whole.     Calcium Carbonate (CALCIUM 500 PO) Take by mouth.     ciprofloxacin-dexamethasone (CIPRODEX) OTIC suspension Place 4 drops into both ears 2 (two) times daily. 7.5 mL 0   Cyanocobalamin (VITAMIN B 12 PO) Take 1 tablet by mouth daily.     Multiple Vitamin (MULTIVITAMIN) tablet Take 1 tablet by mouth daily.     nitroGLYCERIN (NITROSTAT) 0.4 MG SL tablet PLACE 1 TABLET UNDER THE TONGUE EVERY 5 MINUTES AS  NEEDED FOR CHEST PAIN. 25 tablet 4   polyethylene glycol powder (GLYCOLAX/MIRALAX) 17 GM/SCOOP powder Take 17 g by mouth 2 (two) times daily as needed. 3350 g 1   triamcinolone (KENALOG) 0.025 % cream Apply 1 Application topically 2 (two) times daily.     VITAMIN D, CHOLECALCIFEROL, PO Take 1 tablet by mouth daily.     No current facility-administered medications for this visit.    Allergies as of 06/01/2023   (No Known Allergies)    Family History  Problem Relation Age of Onset   BRCA 1/2 Neg Hx    Breast cancer Neg Hx     Review of Systems:    Constitutional: No weight loss, fever, chills, weakness or fatigue HEENT: Eyes: No change in vision               Ears, Nose, Throat:  No change in hearing or congestion Skin: No rash or itching Cardiovascular: No chest pain, chest pressure or palpitations   Respiratory: No SOB or cough Gastrointestinal: See HPI and otherwise negative Genitourinary: No dysuria or change in urinary frequency Neurological: No headache, dizziness or syncope Musculoskeletal: No new muscle or joint pain Hematologic: No bleeding or bruising Psychiatric: No history of depression or anxiety    Physical Exam:  Vital signs: LMP  (LMP Unknown)   Constitutional:   Pleasant Caucasian female*** appears to be in NAD, Well developed, Well nourished, alert and cooperative Head:  Normocephalic and atraumatic. Eyes:  PEERL, EOMI. No icterus. Conjunctiva pink. Ears:  Normal auditory acuity. Neck:  Supple Throat: Oral cavity and pharynx without inflammation, swelling or lesion.  Respiratory: Respirations even and unlabored. Lungs clear to auscultation bilaterally.   No wheezes, crackles, or rhonchi.  Cardiovascular: Normal S1, S2. Regular rate and rhythm. No peripheral edema, cyanosis or pallor.  Gastrointestinal:  Soft, nondistended, nontender. No rebound or guarding. Normal bowel sounds. No appreciable masses or hepatomegaly. Rectal:  Not performed.   Anoscopy: Msk:  Symmetrical without gross deformities. Without edema, no deformity or joint abnormality.  Neurologic:  Alert and  oriented x4;  grossly normal neurologically.  Skin:   Dry and intact without significant lesions or rashes. Psychiatric: Oriented to person, place and time. Demonstrates good judgement and reason without abnormal affect or behaviors.  RELEVANT LABS AND IMAGING: CBC    Latest Ref Rng & Units 04/22/2023    3:00 PM 10/12/2022    9:01 AM 04/15/2022    8:50 AM  CBC  WBC 4.0 - 10.5 K/uL 6.0  4.9  4.4   Hemoglobin 12.0 - 15.0 g/dL 16.1  09.6  04.5   Hematocrit 36.0 - 46.0 % 41.1  42.0  40.7   Platelets 150.0 - 400.0 K/uL 309.0  293.0  298.0      CMP     Latest Ref Rng & Units 04/22/2023    3:00 PM 12/31/2022    9:24 AM 10/12/2022    9:01 AM  CMP  Glucose 70 - 99 mg/dL 79  94  409   BUN 6 - 23 mg/dL 18  12  16    Creatinine 0.40 - 1.20 mg/dL 8.11  9.14  7.82   Sodium 135 - 145 mEq/L 143  147  143   Potassium 3.5 - 5.1 mEq/L 4.4  4.3  5.1   Chloride 96 - 112 mEq/L 103  106  106   CO2 19 - 32 mEq/L 31  25  29    Calcium 8.4 - 10.5 mg/dL 9.7  9.5  9.9   Total Protein 6.0 - 8.3 g/dL 6.6   6.3   Total Bilirubin 0.2 - 1.2 mg/dL 0.4   0.4   Alkaline Phos 39 - 117 U/L 54   50   AST 0 - 37 U/L 21   19   ALT 0 - 35 U/L 13   13      Lab Results  Component Value Date   TSH 2.09 04/22/2023     Assessment: 1. ***  Plan: 1. ***   Thank you for the courtesy of this consult. Please call me with any questions or concerns.   Leith Szafranski, FNP-C [facility] 05/31/2023, 1:42 PM  Cc: Ardith Dark, MD

## 2023-06-01 ENCOUNTER — Ambulatory Visit (INDEPENDENT_AMBULATORY_CARE_PROVIDER_SITE_OTHER): Payer: Medicare Other | Admitting: Gastroenterology

## 2023-06-01 ENCOUNTER — Ambulatory Visit
Admission: RE | Admit: 2023-06-01 | Discharge: 2023-06-01 | Disposition: A | Discharge status: Home / Self Care | Payer: Medicare Other | Source: Ambulatory Visit | Attending: Gastroenterology

## 2023-06-01 ENCOUNTER — Encounter: Payer: Self-pay | Admitting: Gastroenterology

## 2023-06-01 VITALS — BP 126/72 | HR 86 | Ht 61.0 in | Wt 194.2 lb

## 2023-06-01 DIAGNOSIS — R143 Flatulence: Secondary | ICD-10-CM

## 2023-06-01 DIAGNOSIS — R151 Fecal smearing: Secondary | ICD-10-CM

## 2023-06-01 DIAGNOSIS — K5904 Chronic idiopathic constipation: Secondary | ICD-10-CM

## 2023-06-01 DIAGNOSIS — R109 Unspecified abdominal pain: Secondary | ICD-10-CM | POA: Diagnosis not present

## 2023-06-01 DIAGNOSIS — R140 Abdominal distension (gaseous): Secondary | ICD-10-CM

## 2023-06-01 NOTE — Patient Instructions (Addendum)
 Follow high fiber diet Drink plenty of fluids Activity as tolerated No straining on toilet Start Citrucel 1 teaspoon daily over the counter Start Milk of Magnesium daily over the counter for constipation. If taking it daily causes too much diarrhea, stop medication and let our office know. We provided samples of over the counter IBgard 2 capsules with meals to help with gas and bloat  Your provider has requested that you have an abdominal x ray before leaving today. Please go to the basement floor to our Radiology department for the test.  Thank you for trusting me with your gastrointestinal care!   Deanna May, NP    _______________________________________________________  If your blood pressure at your visit was 140/90 or greater, please contact your primary care physician to follow up on this.  _______________________________________________________  If you are age 68 or older, your body mass index should be between 23-30. Your Body mass index is 36.7 kg/m. If this is out of the aforementioned range listed, please consider follow up with your Primary Care Provider.  If you are age 21 or younger, your body mass index should be between 19-25. Your Body mass index is 36.7 kg/m. If this is out of the aformentioned range listed, please consider follow up with your Primary Care Provider.   ________________________________________________________  The [facility] providers would like to encourage you to use [facility] to communicate with providers for non-urgent requests or questions.  Due to long hold times on the telephone, sending your provider a message by [facility] may be a faster and more efficient way to get a response.  Please allow 48 business hours for a response.  Please remember that this is for non-urgent requests.  _______________________________________________________

## 2023-06-01 NOTE — Progress Notes (Signed)
 Agree with assessment and plan as outlined. Due to age we would not pursue a regular surveillance colonoscopy unless she had high risk pathology on her last exam. Would treat for fiber in the interim and see how it helps her symptoms.

## 2023-06-24 ENCOUNTER — Telehealth: Payer: Self-pay | Admitting: Gastroenterology

## 2023-06-24 NOTE — Telephone Encounter (Signed)
 Patient called and stated that she was trying to send a picture where she it in her report. Patient is requesting a call back.  Please advise.

## 2023-06-24 NOTE — Telephone Encounter (Signed)
 Patient called and stated because she is a little worried that she has received someone else xray report through mychart. Patient wants to make clear that she does not have history of cancer nor has she had any surgeries regarding the removal of her gall bladder. Patient is requesting a call back to confirm that the nurse has received her message. Please advise.

## 2023-06-24 NOTE — Telephone Encounter (Signed)
 Patient called in stating her results from her abdominal XR mention that her gallbladder was removed & has a history of cancer. I do not see this in writing anywhere in the report. She is going to upload copy into mychart & will review once she sends it in.

## 2023-06-24 NOTE — Telephone Encounter (Signed)
 Spoke with patient & she will bring by a copy of report since unable to upload copy. States the report she received for XR on 2/18 is not matching up to what we are seeing on our end.

## 2023-06-30 ENCOUNTER — Encounter: Payer: Self-pay | Admitting: Podiatry

## 2023-06-30 ENCOUNTER — Ambulatory Visit (INDEPENDENT_AMBULATORY_CARE_PROVIDER_SITE_OTHER): Payer: Medicare Other | Admitting: Podiatry

## 2023-06-30 DIAGNOSIS — M79675 Pain in left toe(s): Secondary | ICD-10-CM | POA: Diagnosis not present

## 2023-06-30 DIAGNOSIS — B351 Tinea unguium: Secondary | ICD-10-CM

## 2023-06-30 DIAGNOSIS — M79674 Pain in right toe(s): Secondary | ICD-10-CM | POA: Diagnosis not present

## 2023-07-01 ENCOUNTER — Encounter: Payer: Self-pay | Admitting: Gastroenterology

## 2023-07-05 NOTE — Progress Notes (Signed)
  Subjective:  Patient ID: Katelyn Estes, female    DOB: 09-08-1930,  MRN: [id number]  89 y.o. female presents to clinic with  painful, elongated thickened toenails x 10 which are symptomatic when wearing enclosed shoe gear. This interferes with his/her daily activities.  Chief Complaint  Patient presents with   Nail Problem    "Cut my toenails."   New problem(s): None   PCP is Ardith Dark, MD.  No Known Allergies  Review of Systems: Negative except as noted in the HPI.   Objective:  Katelyn Estes is a pleasant 89 y.o. female in NAD. AAO x 3.  Vascular Examination: Vascular status intact b/l with palpable pedal pulses. CFT immediate b/l.  No pain with calf compression b/l. Skin temperature gradient WNL b/l. Lymphedema present BLE.  Neurological Examination: Sensation grossly intact b/l with 10 gram monofilament. Vibratory sensation intact b/l.   Dermatological Examination: Pedal skin with normal turgor, texture and tone b/l. Toenails 1-5 b/l thick, discolored, elongated with subungual debris and pain on dorsal palpation.  No corns, calluses nor porokeratotic lesions noted.  Musculoskeletal Examination: Muscle strength 5/5 to b/l LE. No gross bony deformities bilaterally. Uses walking stick for ambulation assistance.  Radiographs: None  Last A1c:      Latest Ref Rng & Units 04/22/2023    3:00 PM 10/12/2022    9:01 AM  Hemoglobin A1C  Hemoglobin-A1c 4.6 - 6.5 % 6.1  5.8      Assessment:   1. Pain due to onychomycosis of toenails of both feet    Plan:  Patient was evaluated and treated. All patient's and/or POA's questions/concerns addressed on today's visit. Toenails 1-5 debrided in length and girth without incident. Continue soft, supportive shoe gear daily. Report any pedal injuries to medical professional. Call office if there are any questions/concerns. -Patient/POA to call should there be question/concern in the interim.  Return in about 3 months (around  09/30/2023).  Freddie Breech, DPM      [place] LOCATION: [address] [place] [address]                   Office [phone]   [facility] LOCATION: [address] [place], [place] [address] Office [phone]

## 2023-07-06 NOTE — Telephone Encounter (Signed)
 Patient dropped off screen shots of her MyChart.  I read the screenshots and they are radiology examples, not her actual results.  I reassured her the screenshots are not part of her current EPIC chart and that there are no results for a CT in her chart.  She states that the images she has on her phone were sent to her by [facility].  I reviewed her chart extensively and reassured her that there is no evidence of the screen shots in her EPIC chart with [facility].  I walked her through getting back in her MyChart account and asked if she can pull up the screen shots again and see a date or what they are attached to.  She was not able to locate them again.  The screen shots are of Patient education material regarding imaging studies and lists examples, one of them being the statement that she believes is from her chart.  I reassured her again that this all says they are examples and that we have no CT results in her chart and that she has no evidence of liver lesions in any of her chart.    We discussed that is she was able to locate the documents again in her MyChart and has a date or a result that they are attached to we can have the [facility] IT department research where they came from.  We again reviewed her 2 View abdomen results and recommendations from Deanna May, RNP/  She will call back if she has any additional questions or concerns.  She thanked me for the call.

## 2023-07-26 ENCOUNTER — Ambulatory Visit (INDEPENDENT_AMBULATORY_CARE_PROVIDER_SITE_OTHER): Payer: Medicare Other

## 2023-07-26 VITALS — BP 120/82 | HR 97 | Temp 98.4°F | Ht 62.0 in | Wt 191.8 lb

## 2023-07-26 DIAGNOSIS — Z0000 Encounter for general adult medical examination without abnormal findings: Secondary | ICD-10-CM

## 2023-07-26 NOTE — Patient Instructions (Signed)
 Katelyn Estes , Thank you for taking time to come for your Medicare Wellness Visit. I appreciate your ongoing commitment to your health goals. Please review the following plan we discussed and let me know if I can assist you in the future.   Referrals/Orders/Follow-Ups/Clinician Recommendations: Each day, aim for 6 glasses of water, plenty of protein in your diet and try to get up and walk/ stretch every hour for 5-10 minutes at a time.  Continue losing weight   This is a list of the screening recommended for you and due dates:  Health Maintenance  Topic Date Due   Medicare Annual Wellness Visit  07/20/2023   Flu Shot  11/12/2023   DEXA scan (bone density measurement)  04/16/2024   HPV Vaccine  Aged Out   Meningitis B Vaccine  Aged Out   DTaP/Tdap/Td vaccine  Discontinued   Pneumonia Vaccine  Discontinued   COVID-19 Vaccine  Discontinued   Zoster (Shingles) Vaccine  Discontinued    Advanced directives: (Copy Requested) Please bring a copy of your health care power of attorney and living will to the office to be added to your chart at your convenience. You can mail to [facility] [address] [place], [place] [address] or email to [email or web address]  Next Medicare Annual Wellness Visit scheduled for next year: Yes

## 2023-07-26 NOTE — Progress Notes (Signed)
 Subjective:   Katelyn Estes is a 89 y.o. who presents for a Medicare Wellness preventive visit.  Visit Complete: In person  VideoDeclined- This patient declined Interactive audio and Acupuncturist. Therefore the visit was completed with audio only.  Persons Participating in Visit: Patient.  AWV Questionnaire: Yes: Patient Medicare AWV questionnaire was completed by the patient on 07/22/23; I have confirmed that all information answered by patient is correct and no changes since this date.  Cardiac Risk Factors include: advanced age (>71men, >21 women);dyslipidemia;hypertension;obesity (BMI >30kg/m2)     Objective:    Today's Vitals   07/26/23 1044  BP: 120/82  Pulse: 97  Temp: 98.4 F (36.9 C)  SpO2: 93%  Weight: 191 lb 12.8 oz (87 kg)  Height: 5\' 2"  (1.575 m)   Body mass index is 35.08 kg/m.     07/26/2023   10:51 AM 10/16/2021   12:50 PM  Advanced Directives  Does Patient Have a Medical Advance Directive? Yes No  Type of Estate agent of Katelyn Estes;Living will   Copy of Healthcare Power of Attorney in Chart? No - copy requested   Would patient like information on creating a medical advance directive?  No - Patient declined    Current Medications (verified) Outpatient Encounter Medications as of 07/26/2023  Medication Sig   ammonium lactate (LAC-HYDRIN) 12 % lotion Apply to both feet up to twice daily. Avoid application between toes.   aspirin EC 81 MG tablet Take 1 tablet (81 mg total) by mouth daily. Swallow whole.   Calcium Carbonate (CALCIUM 500 PO) Take by mouth.   Multiple Vitamin (MULTIVITAMIN) tablet Take 1 tablet by mouth daily.   nitroGLYCERIN (NITROSTAT) 0.4 MG SL tablet PLACE 1 TABLET UNDER THE TONGUE EVERY 5 MINUTES AS NEEDED FOR CHEST PAIN. (Patient taking differently: On hand)   VITAMIN D, CHOLECALCIFEROL, PO Take 1 tablet by mouth daily.   polyethylene glycol powder (GLYCOLAX/MIRALAX) 17 GM/SCOOP powder Take 17 g by mouth  2 (two) times daily as needed. (Patient not taking: Reported on 06/01/2023)   No facility-administered encounter medications on file as of 07/26/2023.    Allergies (verified) Patient has no known allergies.   History: Past Medical History:  Diagnosis Date   Arthritis    Cataracts, bilateral    Congestive heart disease (HCC)    Past Surgical History:  Procedure Laterality Date   BACK SURGERY  2005   BREAST BIOPSY     30+/many years ago   REPLACEMENT TOTAL KNEE BILATERAL  2003   TONSILLECTOMY  1939   Family History  Problem Relation Age of Onset   Diabetes Son    BRCA 1/2 Neg Hx    Breast cancer Neg Hx    Social History   Socioeconomic History   Marital status: Widowed    Spouse name: Not on file   Number of children: Not on file   Years of education: Not on file   Highest education level: Associate degree: academic program  Occupational History   Not on file  Tobacco Use   Smoking status: Never   Smokeless tobacco: Never  Vaping Use   Vaping status: Never Used  Substance and Sexual Activity   Alcohol use: Yes    Comment: occ   Drug use: Never   Sexual activity: Not on file  Other Topics Concern   Not on file  Social History Narrative   Not on file   Social Drivers of Health   Financial Resource Strain: Patient Declined (  07/22/2023)   Overall Financial Resource Strain (CARDIA)    Difficulty of Paying Living Expenses: Patient declined  Food Insecurity: No Food Insecurity (07/26/2023)   Hunger Vital Sign    Worried About Running Out of Food in the Last Year: Never true    Ran Out of Food in the Last Year: Never true  Transportation Needs: No Transportation Needs (07/22/2023)   PRAPARE - Administrator, Civil Service (Medical): No    Lack of Transportation (Non-Medical): No  Physical Activity: Insufficiently Active (07/26/2023)   Exercise Vital Sign    Days of Exercise per Week: 1 day    Minutes of Exercise per Session: 50 min  Stress: No Stress  Concern Present (07/26/2023)   Katelyn Estes of Occupational Health - Occupational Stress Questionnaire    Feeling of Stress : Not at all  Social Connections: Moderately Integrated (07/22/2023)   Social Connection and Isolation Panel [NHANES]    Frequency of Communication with Friends and Family: More than three times a week    Frequency of Social Gatherings with Friends and Family: More than three times a week    Attends Religious Services: More than 4 times per year    Active Member of [employer] or Organizations: Yes    Attends Banker Meetings: More than 4 times per year    Marital Status: Widowed    Tobacco Counseling Counseling given: Not Answered    Clinical Intake:  Pre-visit preparation completed: Yes  Pain : No/denies pain     BMI - recorded: 35.08 Nutritional Status: BMI > 30  Obese Diabetes: No  Lab Results  Component Value Date   HGBA1C 6.1 04/22/2023   HGBA1C 5.8 10/12/2022   HGBA1C 5.8 04/15/2022     How often do you need to have someone help you when you read instructions, pamphlets, or other written materials from your doctor or pharmacy?: 1 - Never  Interpreter Needed?: No  Information entered by :: Katelyn Pilsner, LPN   Activities of Daily Living     07/22/2023    2:56 PM  In your present state of health, do you have any difficulty performing the following activities:  Hearing? 1  Comment hearing aids  Vision? 0  Difficulty concentrating or making decisions? 0  Dressing or bathing? 0  Doing errands, shopping? 0  Preparing Food and eating ? N  Using the Toilet? N  In the past six months, have you accidently leaked urine? N  Do you have problems with loss of bowel control? N  Managing your Medications? N  Managing your Finances? N    Patient Care Team: Katelyn Clamp, MD as PCP - General (Family Medicine) Katelyn Rule, MD as PCP - Cardiology (Cardiology)  Indicate any recent Medical Services you may have received from  other than [facility] providers in the past year (date may be approximate).     Assessment:   This is a routine wellness examination for Katelyn Estes.  Hearing/Vision screen Hearing Screening - Comments:: Pt wears hearing aids  Vision Screening - Comments:: Wears rx glasses - up to date with routine eye exams with Dr Katelyn Estes    Goals Addressed             This Visit's Progress    Patient Stated       Working on losing weight        Depression Screen     07/26/2023   10:50 AM 04/22/2023    2:58 PM 10/12/2022  8:20 AM 07/20/2022    4:12 PM 04/15/2022    8:07 AM 12/30/2021   12:50 PM 10/15/2021    8:03 AM  PHQ 2/9 Scores  PHQ - 2 Score 0 1 0 0 0 0 0  PHQ- 9 Score  2         Fall Risk     07/22/2023    2:56 PM 04/22/2023    2:06 PM 07/20/2022    4:16 PM 05/01/2022    9:03 AM 04/15/2022    8:07 AM  Fall Risk   Falls in the past year? 0 0 0 0 0  Number falls in past yr:  0 0 0 0  Injury with Fall?  0 0 0 0  Risk for fall due to : Impaired balance/gait Impaired balance/gait Impaired balance/gait No Fall Risks No Fall Risks  Follow up Falls prevention discussed  Falls prevention discussed Falls evaluation completed     MEDICARE RISK AT HOME:  Medicare Risk at Home Any stairs in or around the home?: (Patient-Rptd) No Home free of loose throw rugs in walkways, pet beds, electrical cords, etc?: (Patient-Rptd) Yes Adequate lighting in your home to reduce risk of falls?: (Patient-Rptd) Yes Life alert?: (Patient-Rptd) No Use of a cane, walker or w/c?: (Patient-Rptd) Yes Grab bars in the bathroom?: (Patient-Rptd) Yes Shower chair or bench in shower?: (Patient-Rptd) Yes Elevated toilet seat or a handicapped toilet?: (Patient-Rptd) Yes  TIMED UP AND GO:  Was the test performed?  Yes  Length of time to ambulate 10 feet: 15 sec Gait steady and fast with assistive device  Cognitive Function: 6CIT completed        07/26/2023   10:52 AM 07/20/2022    4:19 PM  6CIT Screen  What Year? 0  points 0 points  What month? 0 points 0 points  What time? 0 points 0 points  Count back from 20 0 points 0 points  Months in reverse 0 points 2 points  Repeat phrase 0 points 2 points  Total Score 0 points 4 points    Immunizations Immunization History  Administered Date(s) Administered   PFIZER(Purple Top)SARS-COV-2 Vaccination 05/10/2019, 05/31/2019, 03/19/2020   Pneumococcal Polysaccharide-23 04/10/2013    Screening Tests Health Maintenance  Topic Date Due   INFLUENZA VACCINE  11/12/2023   DEXA SCAN  04/16/2024   Medicare Annual Wellness (AWV)  07/25/2024   HPV VACCINES  Aged Out   Meningococcal B Vaccine  Aged Out   DTaP/Tdap/Td  Discontinued   Pneumonia Vaccine 45+ Years old  Discontinued   COVID-19 Vaccine  Discontinued   Zoster Vaccines- Shingrix  Discontinued    Health Maintenance  There are no preventive care reminders to display for this patient.  Health Maintenance Items Addressed: See Nurse Notes  Additional Screening:  Vision Screening: Recommended annual ophthalmology exams for early detection of glaucoma and other disorders of the eye.  Dental Screening: Recommended annual dental exams for proper oral hygiene  Community Resource Referral / Chronic Care Management: CRR required this visit?  No   CCM required this visit?  No     Plan:     I have personally reviewed and noted the following in the patient's chart:   Medical and social history Use of alcohol, tobacco or illicit drugs  Current medications and supplements including opioid prescriptions. Patient is not currently taking opioid prescriptions. Functional ability and status Nutritional status Physical activity Advanced directives List of other physicians Hospitalizations, surgeries, and ER visits in previous 12 months Vitals  Screenings to include cognitive, depression, and falls Referrals and appointments  In addition, I have reviewed and discussed with patient certain  preventive protocols, quality metrics, and best practice recommendations. A written personalized care plan for preventive services as well as general preventive health recommendations were provided to patient.     Bruno Capri, LPN   5/40/9811   After Visit Summary: (In Person-Printed) AVS printed and given to the patient  Notes: Nothing significant to report at this time.

## 2023-08-30 ENCOUNTER — Encounter: Payer: Self-pay | Admitting: Nurse Practitioner

## 2023-08-30 ENCOUNTER — Ambulatory Visit: Admitting: Nurse Practitioner

## 2023-08-30 VITALS — BP 136/74 | HR 88 | Ht 60.5 in | Wt 191.4 lb

## 2023-08-30 DIAGNOSIS — K5909 Other constipation: Secondary | ICD-10-CM

## 2023-08-30 DIAGNOSIS — I2510 Atherosclerotic heart disease of native coronary artery without angina pectoris: Secondary | ICD-10-CM | POA: Diagnosis not present

## 2023-08-30 DIAGNOSIS — Z860100 Personal history of colon polyps, unspecified: Secondary | ICD-10-CM | POA: Diagnosis not present

## 2023-08-30 DIAGNOSIS — K5900 Constipation, unspecified: Secondary | ICD-10-CM

## 2023-08-30 DIAGNOSIS — Z95818 Presence of other cardiac implants and grafts: Secondary | ICD-10-CM

## 2023-08-30 DIAGNOSIS — R011 Cardiac murmur, unspecified: Secondary | ICD-10-CM | POA: Diagnosis not present

## 2023-08-30 NOTE — Progress Notes (Signed)
 08/30/2023 Katelyn Estes [id number] Jan 31, 1931   Chief Complaint: Constipation  History of Present Illness: Katelyn Estes. Stange is a 89 year old female with a past medical history of arthritis, hyperlipidemia, coronary artery disease s/p stent placement 2015, CHF, lymphedema, sleep apnea, chronic constipation and colon polyps. She was initially seen in office by [facility] May, NP 06/01/2023 for further evaluation regarding chronic constipation. She was instructed to increase her dietary fiber and to start Citrucel. An abdominal x-ray showed a moderate stool burden without obstruction.  She indicated having a colonoscopy in 2019 and 9 polyps were removed.  Her colonoscopy records were requested but have not yet been received.  She presents today for further follow-up.  She possibly increased her water intake and a fiber supplement for a few days and her constipation significantly improved. She is no longer passing marblelike stools daily and is passing more formed longer stools but continues to strain when passing a BM.  In the past, she has severe sciatica which was worsened by constipation and when she ate things and drink prune juice her constipation improved and her sciatica related pain abated.  No bloody or black stools. She has a friend who is in their 60s and was recently diagnosed with colon cancer which concerns her and she questions if she should have another colonoscopy at this time.  On exam today she has a 2/6 systolic murmur, no prior history of a heart murmur.  She denies having any chest pain but stated she has intermittent shortness of breath/DOE for the past year.  She has chronic lymphedema in her legs. She has a history of CAD with a stent placement to the mid LAD in 2015. On aspirin , no longer on Plavix.  She last saw her cardiologist Dr. Nishan 12/03/2018 for, at that time her cardiac status was stable and she was instructed to continue ASA 81 mg daily and weight loss was  recommended.     Latest Ref Rng & Units 04/22/2023    3:00 PM 10/12/2022    9:01 AM 04/15/2022    8:50 AM  CBC  WBC 4.0 - 10.5 K/uL 6.0  4.9  4.4   Hemoglobin 12.0 - 15.0 g/dL 56.2  13.0  86.5   Hematocrit 36.0 - 46.0 % 41.1  42.0  40.7   Platelets 150.0 - 400.0 K/uL 309.0  293.0  298.0       Latest Ref Rng & Units 04/22/2023    3:00 PM 12/31/2022    9:24 AM 10/12/2022    9:01 AM  CMP  Glucose 70 - 99 mg/dL 79  94  784   BUN 6 - 23 mg/dL 18  12  16    Creatinine 0.40 - 1.20 mg/dL 6.96  2.95  2.84   Sodium 135 - 145 mEq/L 143  147  143   Potassium 3.5 - 5.1 mEq/L 4.4  4.3  5.1   Chloride 96 - 112 mEq/L 103  106  106   CO2 19 - 32 mEq/L 31  25  29    Calcium 8.4 - 10.5 mg/dL 9.7  9.5  9.9   Total Protein 6.0 - 8.3 g/dL 6.6   6.3   Total Bilirubin 0.2 - 1.2 mg/dL 0.4   0.4   Alkaline Phos 39 - 117 U/L 54   50   AST 0 - 37 U/L 21   19   ALT 0 - 35 U/L 13   13    TSH 2.09  Past Medical  History:  Diagnosis Date   Arthritis    Cataracts, bilateral    Congestive heart disease (HCC)    Past Surgical History:  Procedure Laterality Date   BACK SURGERY  2005   BREAST BIOPSY     30+/many years ago   REPLACEMENT TOTAL KNEE BILATERAL  2003   TONSILLECTOMY  1939   Current Outpatient Medications on File Prior to Visit  Medication Sig Dispense Refill   ammonium lactate  (LAC-HYDRIN ) 12 % lotion Apply to both feet up to twice daily. Avoid application between toes. 400 g 5   aspirin  EC 81 MG tablet Take 1 tablet (81 mg total) by mouth daily. Swallow whole.     Multiple Vitamin (MULTIVITAMIN) tablet Take 1 tablet by mouth daily.     nitroGLYCERIN  (NITROSTAT ) 0.4 MG SL tablet PLACE 1 TABLET UNDER THE TONGUE EVERY 5 MINUTES AS NEEDED FOR CHEST PAIN. (Patient taking differently: On hand) 25 tablet 4   VITAMIN D , CHOLECALCIFEROL, PO Take 1 tablet by mouth daily.     Calcium Carbonate (CALCIUM 500 PO) Take by mouth. (Patient not taking: Reported on 08/30/2023)     polyethylene glycol powder  (GLYCOLAX /MIRALAX ) 17 GM/SCOOP powder Take 17 g by mouth 2 (two) times daily as needed. (Patient not taking: Reported on 08/30/2023) 3350 g 1   No current facility-administered medications on file prior to visit.   No Known Allergies  Current Medications, Allergies, Past Medical History, Past Surgical History, Family History and Social History were reviewed in [employer] record.  Review of Systems:   Constitutional: Negative for fever, sweats, chills or weight loss.  Respiratory: Negative for shortness of breath.   Cardiovascular: See HPI. Gastrointestinal: See HPI.  Musculoskeletal: Negative for back pain or muscle aches.  Neurological: Negative for dizziness, headaches or paresthesias.   Physical Exam: BP 136/74   Pulse 88   Ht 5' 0.5" (1.537 m)   Wt 191 lb 6.4 oz (86.8 kg)   LMP  (LMP Unknown)   BMI 36.76 kg/m   Wt Readings from Last 3 Encounters:  08/30/23 191 lb 6.4 oz (86.8 kg)  07/26/23 191 lb 12.8 oz (87 kg)  06/01/23 194 lb 4 oz (88.1 kg)    General: 89 year old female in no acute distress. Head: Normocephalic and atraumatic. Eyes: No scleral icterus. Conjunctiva pink . Ears: Normal auditory acuity. Mouth: Dentition intact. No ulcers or lesions.  Lungs: Clear throughout to auscultation. Heart: Regular rate and rhythm, II/VI systolic murmur Abdomen: Soft, obese abdomen. Nontender and nondistended. No masses or hepatomegaly. Normal bowel sounds x 4 quadrants.  Rectal: Deferred  Musculoskeletal: Symmetrical with no gross deformities. Extremities: 1+ bilateral LE edema/lymphedema. Neurological: Alert oriented x 4. No focal deficits.  Psychological: Alert and cooperative. Normal mood and affect  Assessment and Recommendations:  89 year old female with chronic constipation, improving, continue to strain when passing a BM - Benefiber 1 tablespoon daily - MiraLAX  1/2 to 1 capful mixed in 8 ounces of clear liquid of choice once daily as needed -  Follow-up as needed  History of colon polyps.  Patient endorsed having a colonoscopy in 2019 which identified 9 polyps removed from the colon completed by GI in [place] .  No known family history of colorectal cancer. -Second request for colonoscopy procedure and path report per GI  at [facility] hospital in [place], [place]   - No further colon polyp surveillance colonoscopy secondary to age  CAD s/p LAD stent in 2015. On ASA. No angina. Intermittent SOB/DOE x 1 year. -  Patient instructed to follow-up with cardiologist Dr. Stann Earnest  Heart murmur, new - Patient strictly follow-up with cardiologist Dr. Stann Earnest as noted above, consider ECHO

## 2023-08-30 NOTE — Patient Instructions (Addendum)
 We are requesting your Colonoscopy records from [facility] in [place] .   Everett Hitt, PA-C, recommends that you follow up with Dr Stann Earnest regarding heart murmur and shortness of breath.  Benefiber one tablespoon once daily to avoid straining   May add Miralax  1/2 or 1 capful mixed in 8 ounces of clear liquid of choice at bed time.  _______________________________________________________  If your blood pressure at your visit was 140/90 or greater, please contact your primary care physician to follow up on this.  _______________________________________________________  If you are age 88 or older, your body mass index should be between 23-30. Your Body mass index is 36.76 kg/m. If this is out of the aforementioned range listed, please consider follow up with your Primary Care Provider.  If you are age 4 or younger, your body mass index should be between 19-25. Your Body mass index is 36.76 kg/m. If this is out of the aformentioned range listed, please consider follow up with your Primary Care Provider.   ________________________________________________________  The [facility] providers would like to encourage you to use MYCHART to communicate with providers for non-urgent requests or questions.  Due to long hold times on the telephone, sending your provider a message by [facility] may be a faster and more efficient way to get a response.  Please allow 48 business hours for a response.  Please remember that this is for non-urgent requests.  _______________________________________________________

## 2023-08-31 ENCOUNTER — Telehealth: Payer: Self-pay

## 2023-08-31 DIAGNOSIS — I25119 Atherosclerotic heart disease of native coronary artery with unspecified angina pectoris: Secondary | ICD-10-CM

## 2023-08-31 DIAGNOSIS — R011 Cardiac murmur, unspecified: Secondary | ICD-10-CM

## 2023-08-31 NOTE — Telephone Encounter (Signed)
 Placed order for echo. Called patient to let her know Dr. Stann Earnest wants her to get an echo and someone will call her to schedule.

## 2023-08-31 NOTE — Telephone Encounter (Signed)
-----   Message from Janelle Mediate sent at 08/30/2023 12:54 PM EDT ----- Can order echo for CAD and murmur ----- Message ----- From: Tory Freiberg, NP Sent: 08/30/2023  12:42 PM EDT To: Loyde Rule, MD; Ace Holder, MD  Dr. Stann Earnest, our very nice 89 year old patient was instructed to follow up with you regarding a 2/6 systolic murmur with c/o intermittent SOB.  Katelyn Estes

## 2023-08-31 NOTE — Progress Notes (Signed)
 Agree with assessment and plan as outlined.

## 2023-09-09 ENCOUNTER — Ambulatory Visit (HOSPITAL_COMMUNITY)
Admission: RE | Admit: 2023-09-09 | Discharge: 2023-09-09 | Disposition: A | Discharge status: Home / Self Care | Source: Ambulatory Visit | Attending: Cardiology | Admitting: Cardiology

## 2023-09-09 ENCOUNTER — Ambulatory Visit: Payer: Self-pay | Admitting: Cardiovascular Disease

## 2023-09-09 DIAGNOSIS — R011 Cardiac murmur, unspecified: Secondary | ICD-10-CM

## 2023-09-09 DIAGNOSIS — I25119 Atherosclerotic heart disease of native coronary artery with unspecified angina pectoris: Secondary | ICD-10-CM | POA: Diagnosis present

## 2023-09-09 LAB — ECHOCARDIOGRAM COMPLETE
AR max vel: 1.57 cm2
AV Area VTI: 1.45 cm2
AV Area mean vel: 1.44 cm2
AV Mean grad: 10.3 mmHg
AV Peak grad: 17.9 mmHg
Ao pk vel: 2.12 m/s
Area-P 1/2: 2.82 cm2
S' Lateral: 2.8 cm

## 2023-10-06 ENCOUNTER — Ambulatory Visit: Payer: Medicare Other | Admitting: Podiatry

## 2023-10-07 ENCOUNTER — Other Ambulatory Visit (HOSPITAL_COMMUNITY)

## 2023-10-22 ENCOUNTER — Ambulatory Visit (INDEPENDENT_AMBULATORY_CARE_PROVIDER_SITE_OTHER): Payer: Medicare Other | Admitting: Family Medicine

## 2023-10-22 ENCOUNTER — Encounter: Payer: Self-pay | Admitting: Family Medicine

## 2023-10-22 VITALS — BP 116/75 | HR 85 | Temp 98.1°F | Ht 60.0 in | Wt 195.6 lb

## 2023-10-22 DIAGNOSIS — E785 Hyperlipidemia, unspecified: Secondary | ICD-10-CM

## 2023-10-22 DIAGNOSIS — I10 Essential (primary) hypertension: Secondary | ICD-10-CM | POA: Diagnosis not present

## 2023-10-22 DIAGNOSIS — I25119 Atherosclerotic heart disease of native coronary artery with unspecified angina pectoris: Secondary | ICD-10-CM

## 2023-10-22 DIAGNOSIS — L299 Pruritus, unspecified: Secondary | ICD-10-CM

## 2023-10-22 DIAGNOSIS — R739 Hyperglycemia, unspecified: Secondary | ICD-10-CM

## 2023-10-22 DIAGNOSIS — E559 Vitamin D deficiency, unspecified: Secondary | ICD-10-CM

## 2023-10-22 DIAGNOSIS — L659 Nonscarring hair loss, unspecified: Secondary | ICD-10-CM | POA: Insufficient documentation

## 2023-10-22 LAB — HEMOGLOBIN A1C: Hgb A1c MFr Bld: 6 % (ref 4.6–6.5)

## 2023-10-22 LAB — COMPREHENSIVE METABOLIC PANEL WITH GFR
ALT: 13 U/L (ref 0–35)
AST: 17 U/L (ref 0–37)
Albumin: 4 g/dL (ref 3.5–5.2)
Alkaline Phosphatase: 50 U/L (ref 39–117)
BUN: 18 mg/dL (ref 6–23)
CO2: 32 meq/L (ref 19–32)
Calcium: 9.3 mg/dL (ref 8.4–10.5)
Chloride: 103 meq/L (ref 96–112)
Creatinine, Ser: 0.61 mg/dL (ref 0.40–1.20)
GFR: 78.39 mL/min (ref >60.00)
Glucose, Bld: 86 mg/dL (ref 70–99)
Potassium: 4.5 meq/L (ref 3.5–5.1)
Sodium: 141 meq/L (ref 135–145)
Total Bilirubin: 0.4 mg/dL (ref 0.2–1.2)
Total Protein: 6.5 g/dL (ref 6.0–8.3)

## 2023-10-22 LAB — CBC
HCT: 40.3 % (ref 36.0–46.0)
Hemoglobin: 13.2 g/dL (ref 12.0–15.0)
MCHC: 32.9 g/dL (ref 30.0–36.0)
MCV: 96.9 fl (ref 78.0–100.0)
Platelets: 250 K/uL (ref 150.0–400.0)
RBC: 4.15 Mil/uL (ref 3.87–5.11)
RDW: 14.3 % (ref 11.5–15.5)
WBC: 4.4 K/uL (ref 4.0–10.5)

## 2023-10-22 LAB — IBC + FERRITIN
Ferritin: 42.3 ng/mL (ref 10.0–291.0)
Iron: 63 ug/dL (ref 42–145)
Saturation Ratios: 18.2 % — ABNORMAL LOW (ref 20.0–50.0)
TIBC: 345.8 ug/dL (ref 250.0–450.0)
Transferrin: 247 mg/dL (ref 212.0–360.0)

## 2023-10-22 LAB — MAGNESIUM: Magnesium: 2.1 mg/dL (ref 1.5–2.5)

## 2023-10-22 LAB — VITAMIN B12: Vitamin B-12: 399 pg/mL (ref 211–911)

## 2023-10-22 LAB — FOLATE: Folate: >23.4 ng/mL (ref >5.9)

## 2023-10-22 LAB — VITAMIN D 25 HYDROXY (VIT D DEFICIENCY, FRACTURES): VITD: 50.01 ng/mL (ref 30.00–100.00)

## 2023-10-22 LAB — TSH: TSH: 2.24 u[IU]/mL (ref 0.35–5.50)

## 2023-10-22 NOTE — Assessment & Plan Note (Signed)
 No red flags.  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  We are checking labs today.  Recommended topical minoxidil.  If no improvement will need to have her follow-up with dermatology.

## 2023-10-22 NOTE — Patient Instructions (Addendum)
 It was very nice to see you today!  We will check blood work today.   Please try using rogaine for women.   Please trying applying lotion twice per day.   Return in about 6 months (around 04/23/2024) for Follow Up.   Take care, Dr Kennyth  PLEASE NOTE:  If you had any lab tests, please let us  know if you have not heard back within a few days. You may see your results on mychart before we have a chance to review them but we will give you a call once they are reviewed by us .   If we ordered any referrals today, please let us  know if you have not heard from their office within the next week.   If you had any urgent prescriptions sent in today, please check with the pharmacy within an hour of our visit to make sure the prescription was transmitted appropriately.   Please try these tips to maintain a healthy lifestyle:  Eat at least 3 REAL meals and 1-2 snacks per day.  Aim for no more than 5 hours between eating.  If you eat breakfast, please do so within one hour of getting up.   Each meal should contain half fruits/vegetables, one quarter protein, and one quarter carbs (no bigger than a computer mouse)  Cut down on sweet beverages. This includes juice, soda, and sweet tea.   Drink at least 1 glass of water with each meal and aim for at least 8 glasses per day  Exercise at least 150 minutes every week.

## 2023-10-22 NOTE — Assessment & Plan Note (Signed)
 Continue management per cardiology.  An echocardiogram performed recently was reassuring.

## 2023-10-22 NOTE — Assessment & Plan Note (Signed)
 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  We are checking labs today per patient request.  It is okay for her to use Sarna lotion.  Also recommend twice daily emollients.  If no improvement she will need to follow-up with dermatology.

## 2023-10-22 NOTE — Assessment & Plan Note (Signed)
 Check A1c.

## 2023-10-22 NOTE — Assessment & Plan Note (Signed)
At goal today without meds. 

## 2023-10-22 NOTE — Assessment & Plan Note (Signed)
 Check vitamin D.

## 2023-10-22 NOTE — Assessment & Plan Note (Signed)
 Check lipids per patient request

## 2023-10-22 NOTE — Progress Notes (Signed)
   Katelyn Estes is a 89 y.o. female who presents today for an office visit.  Assessment/Plan:  Chronic Problems Addressed Today: Essential hypertension At goal today without meds.  Dyslipidemia Check lipids per patient request.  Hyperglycemia Check A1c.  Vitamin D  deficiency Check vitamin D .  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  We are checking labs today per patient request.  It is okay for her to use Sarna lotion.  Also recommend twice daily emollients.  If no improvement she will need to follow-up with dermatology.  Alopecia No red flags.  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  We are checking labs today.  Recommended topical minoxidil.  If no improvement will need to have her follow-up with dermatology.  Coronary artery disease Continue management per cardiology.  An echocardiogram performed recently was reassuring.     Subjective:  HPI:  See assessment / plan for status of chronic conditions. She is here today for follow up. I last saw her about 6 months ago. Since our last visit she has visited with gastroenterology and cardiology. She has noticed more shortness of breath with walking and recently had an echocardiogram which was normal. She would like to have labs drawn today to assess for vitamin deficiency and also routine labs.   She has noticed thinning hair for the last several months to years.  No specific treatments tried for this.  She would like to have labs done today as above.       Objective:  Physical Exam: BP 116/75   Pulse 85   Temp 98.1 F (36.7 C) (Temporal)   Ht 5' (1.524 m)   Wt 195 lb 9.6 oz (88.7 kg)   LMP  (LMP Unknown)   SpO2 96%   BMI 38.20 kg/m   Gen: No acute distress, resting comfortably HEENT: TMs clear bilaterally. CV: Regular rate and rhythm with soft 2/6 systolic murmur appreciated Pulm: Normal work of breathing, clear to auscultation bilaterally with no crackles, wheezes, or rhonchi Skin: Xerosis cutis noted  throughout. Neuro: Grossly normal, moves all extremities Psych: Normal affect and thought content      Katelyn Estes M. Kennyth, MD 10/22/2023 10:37 AM

## 2023-10-25 ENCOUNTER — Ambulatory Visit: Payer: Self-pay | Admitting: Family Medicine

## 2023-10-25 NOTE — Progress Notes (Signed)
 Her labs are all stable.  Do not need to make any changes to her treatment plan at this time.  She should continue her current treatment plan we can recheck in 6 to 12 months.

## 2023-10-26 ENCOUNTER — Ambulatory Visit (INDEPENDENT_AMBULATORY_CARE_PROVIDER_SITE_OTHER): Admitting: Podiatry

## 2023-10-26 ENCOUNTER — Encounter: Payer: Self-pay | Admitting: Podiatry

## 2023-10-26 DIAGNOSIS — B351 Tinea unguium: Secondary | ICD-10-CM

## 2023-10-26 DIAGNOSIS — I890 Lymphedema, not elsewhere classified: Secondary | ICD-10-CM

## 2023-10-26 DIAGNOSIS — M79675 Pain in left toe(s): Secondary | ICD-10-CM

## 2023-10-26 DIAGNOSIS — M79674 Pain in right toe(s): Secondary | ICD-10-CM | POA: Diagnosis not present

## 2023-10-26 DIAGNOSIS — L03119 Cellulitis of unspecified part of limb: Secondary | ICD-10-CM | POA: Diagnosis not present

## 2023-10-26 MED ORDER — DOXYCYCLINE HYCLATE 100 MG PO CAPS
100.0000 mg | ORAL_CAPSULE | Freq: Two times a day (BID) | ORAL | 0 refills | Status: AC
Start: 2023-10-26 — End: 2023-11-02

## 2023-10-26 NOTE — Patient Instructions (Signed)
  Shoe recommendations:  Bard Mimi Adelita Patra Deborrah with mesh uppers. They can be purchased online at [email or web address].    Drew Breezy  Oofos Slippers on [email or web address]. Oofos can also be purchased at [facility].   Orthofeet Retail banker can be purchased on [employer] or on [email or web address]   Coral No-tie lace shoe [email or web address] [place] Diabetic Slippers (available for women and men)

## 2023-10-26 NOTE — Progress Notes (Signed)
  Subjective:  Patient ID: Katelyn Estes, female    DOB: January 05, 1931,  MRN: [id number]  Katelyn Estes presents to clinic today for painful thick toenails that are difficult to trim. Pain interferes with ambulation. Aggravating factors include wearing enclosed shoe gear. Pain is relieved with periodic professional debridement. States she had to go to Urgent care the day after her last visit and was placed on antibiotic cream for painful right toe. She states the toe throbs and gets numb on occasion. Denies any drainage, breaks in skin or pus. Denies any fever, chills, night sweats, nausea or vomiting.   She does use a lymphedema pump. States her right LE is always more swollen than the left LE.             Chief Complaint  Patient presents with   RFC    RFC  A1C 6.1. LOV with PCP 10/22/23 Toenail trim.   PCP is Kennyth Worth HERO, MD.  No Known Allergies  Review of Systems: Negative except as noted in the HPI.  Objective: No changes noted in today's physical examination. There were no vitals filed for this visit. Katelyn Estes is a pleasant 89 y.o. female obese in NAD. AAO x 3.  Vascular Examination: Vascular status intact b/l with palpable pedal pulses. CFT immediate b/l.  No pain with calf compression b/l. Skin temperature gradient WNL b/l. Lymphedema present BLE.  Neurological Examination: Sensation grossly intact b/l with 10 gram monofilament. Vibratory sensation intact b/l.   Dermatological Examination: Transverse erythema noted forefoot area right>left. Toenails 1-5 b/l thick, discolored, elongated with subungual debris and pain on dorsal palpation.  No corns, calluses nor porokeratotic lesions noted.  Musculoskeletal Examination: Ill fitting shoe gear that does not accommodate current state of edema. Muscle strength 5/5 to b/l LE. No gross bony deformities bilaterally. Uses walking stick for ambulation assistance.  Radiographs: None  Assessment/Plan: 1. Pain due to  onychomycosis of toenails of both feet   2. Cellulitis of foot   3. Lymphedema     Meds ordered this encounter  Medications   doxycycline  (VIBRAMYCIN ) 100 MG capsule    Sig: Take 1 capsule (100 mg total) by mouth 2 (two) times daily for 7 days.    Dispense:  14 capsule    Refill:  0    -Patient evaluated and treated.today. All questions/concerns addressed on today's visit. Discussed ill fitting shoe gear which is too tight. -Toenails 1-5 b/l were debrided in length and girth with sterile nail nippers and dremel without iatrogenic bleeding.  -Dispensed list of shoe recommendations to accommodate pedal edema. -For cellulitis, Rx for Doxycyline 100 mg, #14, to be taken one capsule twice daily for 7 days. -Patient/POA to call should there be question/concern in the interim.   Return in about 3 months (around 01/26/2024).  Delon LITTIE Merlin, DPM      [facility] LOCATION: [address], [place] [address]                   Office [phone]   [facility] LOCATION: [address] [place], [place] [address] Office [phone]

## 2023-11-01 NOTE — Telephone Encounter (Signed)
 Copied from CRM [id number]. Topic: Referral - Request for Referral >> Nov 01, 2023 10:14 AM Macario HERO wrote: Did the patient discuss referral with their provider in the last year? Yes (If No - schedule appointment) (If Yes - send message)  Appointment offered? Yes  Type of order/referral and detailed reason for visit: Lymphedema and pain - clinic is currently holding appointment for patient.   Preference of office, provider, location: [facility] -- Fax: [phone]  If referral order, have you been seen by this specialty before? Yes (If Yes, this issue or another issue? When? Where?Last appointment January 2025.  Can we respond through MyChart? No

## 2023-11-02 NOTE — Progress Notes (Signed)
 Can we clarify? Im not sure what the ask is. She was seen there 04/2023 and should not need another referral.  Nakiya Rallis M. Kennyth, MD 11/02/2023 7:32 AM

## 2023-11-03 ENCOUNTER — Other Ambulatory Visit: Payer: Self-pay | Admitting: *Deleted

## 2023-11-03 ENCOUNTER — Telehealth: Payer: Self-pay | Admitting: *Deleted

## 2023-11-03 ENCOUNTER — Telehealth: Payer: Self-pay | Admitting: Family Medicine

## 2023-11-03 DIAGNOSIS — I890 Lymphedema, not elsewhere classified: Secondary | ICD-10-CM

## 2023-11-03 NOTE — Telephone Encounter (Signed)
 Copied from CRM [id number]. Topic: Clinical - Medication Question >> Nov 03, 2023 10:43 AM Jasmin G wrote: Reason for CRM: Pt would like to speak to a nurse about pain management medications. Please call pt back ASAP.  See previews note Referral placed  [facility]

## 2023-11-03 NOTE — Telephone Encounter (Signed)
 Patient needs new Referral for PT at [facility]. Please see message on 10/25/23.  [facility] requested this Referral per Patient.

## 2023-11-03 NOTE — Telephone Encounter (Signed)
 See other messages. Referral was done. Pt aware.

## 2023-11-29 NOTE — Progress Notes (Signed)
 CARDIOLOGY CONSULT NOTE       Patient ID: Katelyn Estes MRN: [id number] DOB/AGE: Aug 23, 1930 89 y.o.  Referring Physician: Kennyth Primary Physician: Kennyth Worth HERO, MD Primary Cardiologist: Delford Reason for Consultation: CAD/Lymphedema    HPI:  89 y.o. referred by Dr Kennyth for CAD. First seen by me 02/18/21 She has chronic lymphedema using pump since 2016 and has seen Dr Harden , wound center and oncology lymphedema center Reviewed notes from [place]  Heart History of stent to mid LAD.  Last cath 2017 with patent stent and moderate dx in proximal LAD and proximal RCA. FFR negative stent patent March 2021 lexiscan myovue normal Echo's have shown normal EF and trace MR. Chronic LE edema, dyspnea and atypical sharp chest pains better with Ranexa Tends to be bradycardic previous monitor with no AV block and self limited SVT. She is overweight and has OSA with recent use of CPAP  No angina Some trigeminal neuralgia and Insomnia   Use to work for [employer] in [employer] Widowed over 45 years Has 4 daughters 3 in [place] And moved to town home here. Having trouble getting socially integrated and finding People to play cards with   Lethargy with coreg  was on atenolol  and d/c   She does not like her new lymph machine Too cumbersome She lives at the [facility] a few houses down from my father in law  Going to PT for her multiple shoulder, leg, back pains Voltaren helps   Needs to be on ASA not taking plavix now Has some neuropathy in legs Thinks lymphedema worse not on diuretic Seen by podiatry 10/26/23 and Rx for cellulitis with doxy.   Updated TTE 09/09/23 reviewed EF 60-65% mild LVH Normal RV trivial MR normal AV  She has had more dyspnea and lymphedema worse. Doesn't like her machine. Not on diuretic and I suggested she should be    ROS All other systems reviewed and negative except as noted above  Past Medical History:  Diagnosis Date   Arthritis    Cataracts, bilateral    Congestive heart disease  (HCC)     Family History  Problem Relation Age of Onset   Diabetes Son    BRCA 1/2 Neg Hx    Breast cancer Neg Hx     Social History   Socioeconomic History   Marital status: Widowed    Spouse name: Not on file   Number of children: Not on file   Years of education: Not on file   Highest education level: Associate degree: academic program  Occupational History   Not on file  Tobacco Use   Smoking status: Never   Smokeless tobacco: Never  Vaping Use   Vaping status: Never Used  Substance and Sexual Activity   Alcohol use: Yes    Comment: occ   Drug use: Never   Sexual activity: Not on file  Other Topics Concern   Not on file  Social History Narrative   Not on file   Social Drivers of Health   Financial Resource Strain: Patient Declined (07/22/2023)   Overall Financial Resource Strain (CARDIA)    Difficulty of Paying Living Expenses: Patient declined  Food Insecurity: No Food Insecurity (07/26/2023)   Hunger Vital Sign    Worried About Running Out of Food in the Last Year: Never true    Ran Out of Food in the Last Year: Never true  Transportation Needs: No Transportation Needs (07/22/2023)   PRAPARE - Administrator, Civil Service (Medical):  No    Lack of Transportation (Non-Medical): No  Physical Activity: Insufficiently Active (07/26/2023)   Exercise Vital Sign    Days of Exercise per Week: 1 day    Minutes of Exercise per Session: 50 min  Stress: No Stress Concern Present (07/26/2023)   [employer] of Occupational Health - Occupational Stress Questionnaire    Feeling of Stress : Not at all  Social Connections: Moderately Integrated (07/22/2023)   Social Connection and Isolation Panel    Frequency of Communication with Friends and Family: More than three times a week    Frequency of Social Gatherings with Friends and Family: More than three times a week    Attends Religious Services: More than 4 times per year    Active Member of [employer] or  Organizations: Yes    Attends Banker Meetings: More than 4 times per year    Marital Status: Widowed  Intimate Partner Violence: Not At Risk (07/26/2023)   Humiliation, Afraid, Rape, and Kick questionnaire    Fear of Current or Ex-Partner: No    Emotionally Abused: No    Physically Abused: No    Sexually Abused: No    Past Surgical History:  Procedure Laterality Date   BACK SURGERY  2005   BREAST BIOPSY     30+/many years ago   REPLACEMENT TOTAL KNEE BILATERAL  2003   TONSILLECTOMY  1939      Current Outpatient Medications:    ammonium lactate  (LAC-HYDRIN ) 12 % lotion, Apply to both feet up to twice daily. Avoid application between toes., Disp: 400 g, Rfl: 5   aspirin  EC 81 MG tablet, Take 1 tablet (81 mg total) by mouth daily. Swallow whole., Disp: , Rfl:    Calcium Carbonate (CALCIUM 500 PO), Take by mouth., Disp: , Rfl:    Multiple Vitamin (MULTIVITAMIN) tablet, Take 1 tablet by mouth daily., Disp: , Rfl:    nitroGLYCERIN  (NITROSTAT ) 0.4 MG SL tablet, PLACE 1 TABLET UNDER THE TONGUE EVERY 5 MINUTES AS NEEDED FOR CHEST PAIN. (Patient taking differently: On hand), Disp: 25 tablet, Rfl: 4   VITAMIN D , CHOLECALCIFEROL, PO, Take 1 tablet by mouth daily., Disp: , Rfl:     Physical Exam: Blood pressure 120/70, pulse 82, height 5' (1.524 m), weight 197 lb (89.4 kg), SpO2 98%.    Affect appropriate Overweight elderly female  HEENT: normal Neck supple with no adenopathy JVP normal no bruits no thyromegaly Lungs clear with no wheezing and good diaphragmatic motion Heart:  S1/S2 no murmur, no rub, gallop or click PMI normal Abdomen: benighn, BS positve, no tenderness, no AAA no bruit.  No HSM or HJR Distal pulses intact with no bruits Bilateral lymphedema prominent over feet right worse than left  Post bilateral TKR;s  Neuro non-focal Skin warm and dry No muscular weakness   Labs:   Lab Results  Component Value Date   WBC 4.4 10/22/2023   HGB 13.2  10/22/2023   HCT 40.3 10/22/2023   MCV 96.9 10/22/2023   PLT 250.0 10/22/2023   No results for input(s): NA, K, CL, CO2, BUN, CREATININE, CALCIUM, PROT, BILITOT, ALKPHOS, ALT, AST, GLUCOSE in the last 168 hours.  Invalid input(s): LABALBU No results found for: CKTOTAL, CKMB, CKMBINDEX, TROPONINI  Lab Results  Component Value Date   CHOL 204 (H) 10/12/2022   CHOL 202 (H) 04/15/2022   CHOL 202 (H) 10/15/2021   Lab Results  Component Value Date   HDL 78.80 10/12/2022   HDL 86.60 04/15/2022  HDL 79.50 10/15/2021   Lab Results  Component Value Date   LDLCALC 105 (H) 10/12/2022   LDLCALC 91 04/15/2022   LDLCALC 103 (H) 10/15/2021   Lab Results  Component Value Date   TRIG 100.0 10/12/2022   TRIG 122.0 04/15/2022   TRIG 98.0 10/15/2021   Lab Results  Component Value Date   CHOLHDL 3 10/12/2022   CHOLHDL 2 04/15/2022   CHOLHDL 3 10/15/2021   No results found for: LDLDIRECT    Radiology: No results found.  EKG: SR rate 82 RBBB/ LAD no change 12/06/2023    ASSESSMENT AND PLAN:   CAD: stent to mid LAD with non ischemic myovue March 2021 Historically on plavix monotherapy but changed to 81 mg ASA Lymphedema:  Sees lymphedema clinic has pump will f/u with wound center BMET normal 10/22/23 Discussed starting lasix  20 mg daily last visit Given dyspnea will take now .  TTE with normal RV/LV function  OSA:  discussed weight loss and CPAP Bradycardia:  history of Tachycardic today see above regarding low dose atenolol   HLD:  LDL 105  f/u primary defers statin given age  Dyspnea: check bmet/bnp  EF normal on echo 09/09/23 will recheck Start lasix  20 mg daily BMET/BNP in 4 weeks   Lasix  20 mg BMET/BNP now and in 4 weeks Echo   F/U in 4=6 weeks  Signed: Maude Emmer 12/06/2023, 10:15 AM

## 2023-12-06 ENCOUNTER — Ambulatory Visit: Attending: Cardiovascular Disease | Admitting: Cardiovascular Disease

## 2023-12-06 ENCOUNTER — Encounter: Payer: Self-pay | Admitting: Cardiovascular Disease

## 2023-12-06 VITALS — BP 120/70 | HR 82 | Ht 60.0 in | Wt 197.0 lb

## 2023-12-06 DIAGNOSIS — E782 Mixed hyperlipidemia: Secondary | ICD-10-CM | POA: Insufficient documentation

## 2023-12-06 DIAGNOSIS — R011 Cardiac murmur, unspecified: Secondary | ICD-10-CM | POA: Diagnosis not present

## 2023-12-06 DIAGNOSIS — I25119 Atherosclerotic heart disease of native coronary artery with unspecified angina pectoris: Secondary | ICD-10-CM | POA: Insufficient documentation

## 2023-12-06 DIAGNOSIS — I10 Essential (primary) hypertension: Secondary | ICD-10-CM | POA: Diagnosis present

## 2023-12-06 DIAGNOSIS — I5030 Unspecified diastolic (congestive) heart failure: Secondary | ICD-10-CM | POA: Diagnosis present

## 2023-12-06 MED ORDER — FUROSEMIDE 20 MG PO TABS: 20.0000 mg | ORAL_TABLET | Freq: Every day | ORAL | 3 refills | Status: AC

## 2023-12-06 NOTE — Patient Instructions (Addendum)
 Medication Instructions:  Start: Furosemide  (Lasix ) 20 mg one tablet, once daily  Lab Work: Today: BMET and BNP   In about one month (due 01/06/2024): BMET and BNP non fasting, can go to any [facility] or back to [address], [address] [place]  If you have labs (blood work) drawn today and your tests are completely normal, you will receive your results only by: MyChart Message (if you have MyChart) OR A paper copy in the mail If you have any lab test that is abnormal or we need to change your treatment, we will call you to review the results.  Testing/Procedures: Your physician has requested that you have an echocardiogram. Echocardiography is a painless test that uses sound waves to create images of your heart. It provides your doctor with information about the size and shape of your heart and how well your heart's chambers and valves are working. This procedure takes approximately one hour. There are no restrictions for this procedure. Please do NOT wear cologne, perfume, aftershave, or lotions (deodorant is allowed). Please arrive 15 minutes prior to your appointment time.  Please note: We ask at that you not bring children with you during ultrasound (echo/ vascular) testing. Due to room size and safety concerns, children are not allowed in the ultrasound rooms during exams. Our front office staff cannot provide observation of children in our lobby area while testing is being conducted. An adult accompanying a patient to their appointment will only be allowed in the ultrasound room at the discretion of the ultrasound technician under special circumstances. We apologize for any inconvenience.   Follow-Up: At [facility], you and your health needs are our priority.  As part of our continuing mission to provide you with exceptional heart care, our providers are all part of one team.  This team includes your primary Cardiologist (physician) and Advanced Practice Providers or APPs  (Physician Assistants and Nurse Practitioners) who all work together to provide you with the care you need, when you need it.  Your next appointment:   4 week(s)-6 weeks  Provider:   Maude Emmer, MD OR Available APP [facility] Cardiology Nurse Practitioner or Physician's Assistant)  Other Instructions Please call us  or send a MyChart message with any Cardiology related questions/concerns.  [phone].  Thank you!

## 2023-12-07 ENCOUNTER — Ambulatory Visit: Payer: Self-pay | Admitting: Cardiovascular Disease

## 2023-12-07 LAB — BASIC METABOLIC PANEL WITH GFR
BUN/Creatinine Ratio: 19 (ref 12–28)
BUN: 11 mg/dL (ref 10–36)
CO2: 25 mmol/L (ref 20–29)
Calcium: 9.5 mg/dL (ref 8.7–10.3)
Chloride: 103 mmol/L (ref 96–106)
Creatinine, Ser: 0.58 mg/dL (ref 0.57–1.00)
Glucose: 83 mg/dL (ref 70–99)
Potassium: 4.6 mmol/L (ref 3.5–5.2)
Sodium: 140 mmol/L (ref 134–144)
eGFR: 85 mL/min/1.73 (ref >59)

## 2023-12-07 LAB — BRAIN NATRIURETIC PEPTIDE: BNP: 30.6 pg/mL (ref 0.0–100.0)

## 2023-12-09 NOTE — Telephone Encounter (Signed)
 Patient called back about lab results. Patient aware of results. Patient verbalized understanding. Patient stated she still feels winded and SOB at times. Patient complaining of pain in her back and legs, due to sciatica. Encouraged patient to call her PCP. Patient was wondering if she could get her echo done earlier. Will send message to see if echo can get moved up. Will make Dr. Delford aware of patient's complaints.

## 2023-12-09 NOTE — Telephone Encounter (Signed)
 Patient returned RN's call.

## 2023-12-24 ENCOUNTER — Ambulatory Visit (HOSPITAL_COMMUNITY)
Admission: RE | Admit: 2023-12-24 | Discharge: 2023-12-24 | Disposition: A | Discharge status: Home / Self Care | Source: Ambulatory Visit | Attending: Cardiovascular Disease | Admitting: Cardiovascular Disease

## 2023-12-24 DIAGNOSIS — R011 Cardiac murmur, unspecified: Secondary | ICD-10-CM | POA: Diagnosis present

## 2023-12-24 DIAGNOSIS — I25119 Atherosclerotic heart disease of native coronary artery with unspecified angina pectoris: Secondary | ICD-10-CM | POA: Diagnosis present

## 2023-12-24 DIAGNOSIS — I10 Essential (primary) hypertension: Secondary | ICD-10-CM | POA: Diagnosis not present

## 2023-12-24 DIAGNOSIS — I5030 Unspecified diastolic (congestive) heart failure: Secondary | ICD-10-CM | POA: Insufficient documentation

## 2023-12-24 LAB — ECHOCARDIOGRAM COMPLETE
AR max vel: 1.57 cm2
AV Area VTI: 1.45 cm2
AV Area mean vel: 1.52 cm2
AV Mean grad: 13 mmHg
AV Peak grad: 24.8 mmHg
Ao pk vel: 2.49 m/s
Area-P 1/2: 2.41 cm2
S' Lateral: 2.5 cm

## 2023-12-27 ENCOUNTER — Other Ambulatory Visit: Payer: Self-pay

## 2023-12-27 DIAGNOSIS — R011 Cardiac murmur, unspecified: Secondary | ICD-10-CM

## 2023-12-27 DIAGNOSIS — I5030 Unspecified diastolic (congestive) heart failure: Secondary | ICD-10-CM

## 2023-12-27 DIAGNOSIS — E782 Mixed hyperlipidemia: Secondary | ICD-10-CM

## 2023-12-27 DIAGNOSIS — I10 Essential (primary) hypertension: Secondary | ICD-10-CM

## 2023-12-27 DIAGNOSIS — I25119 Atherosclerotic heart disease of native coronary artery with unspecified angina pectoris: Secondary | ICD-10-CM

## 2024-01-02 LAB — BASIC METABOLIC PANEL WITH GFR
BUN/Creatinine Ratio: 18 (ref 12–28)
BUN: 11 mg/dL (ref 10–36)
CO2: 23 mmol/L (ref 20–29)
Calcium: 9.1 mg/dL (ref 8.7–10.3)
Chloride: 107 mmol/L — ABNORMAL HIGH (ref 96–106)
Creatinine, Ser: 0.62 mg/dL (ref 0.57–1.00)
Glucose: 87 mg/dL (ref 70–99)
Potassium: 4.2 mmol/L (ref 3.5–5.2)
Sodium: 143 mmol/L (ref 134–144)
eGFR: 84 mL/min/1.73 (ref >59)

## 2024-01-02 LAB — BRAIN NATRIURETIC PEPTIDE: BNP: 74.8 pg/mL (ref 0.0–100.0)

## 2024-01-04 ENCOUNTER — Ambulatory Visit: Admitting: Podiatry

## 2024-01-06 ENCOUNTER — Ambulatory Visit: Admitting: Podiatry

## 2024-01-07 ENCOUNTER — Ambulatory Visit (HOSPITAL_COMMUNITY)

## 2024-01-07 ENCOUNTER — Encounter: Payer: Self-pay | Admitting: Podiatry

## 2024-01-07 ENCOUNTER — Ambulatory Visit (INDEPENDENT_AMBULATORY_CARE_PROVIDER_SITE_OTHER): Admitting: Podiatry

## 2024-01-07 DIAGNOSIS — B351 Tinea unguium: Secondary | ICD-10-CM

## 2024-01-07 DIAGNOSIS — M79674 Pain in right toe(s): Secondary | ICD-10-CM | POA: Diagnosis not present

## 2024-01-07 DIAGNOSIS — I890 Lymphedema, not elsewhere classified: Secondary | ICD-10-CM

## 2024-01-07 DIAGNOSIS — M79675 Pain in left toe(s): Secondary | ICD-10-CM | POA: Diagnosis not present

## 2024-01-11 NOTE — Progress Notes (Unsigned)
 Cardiology Office Note    Patient Name: Katelyn Estes Date of Encounter: 01/11/2024  Primary Care Provider:  Kennyth Worth HERO, MD Primary Cardiologist:  Maude Emmer, MD Primary Electrophysiologist: None   Past Medical History    Past Medical History:  Diagnosis Date   Arthritis    Cataracts, bilateral    Congestive heart disease (HCC)     History of Present Illness  Katelyn Estes is a 89 y.o. female with a PMH of CAD s/p PCI/stent to mid LAD in 2015, chronic lymphedema, OSA (on CPAP), HLD, bradycardia who presents today for follow-up of shortness of breath and lower extremity edema.  Ms. Katelyn Estes was seen initially by Dr. Emmer on 02/18/2021 by referral of her PCP Dr. Kennyth.  She has a cardiac history significant for PCI of the mid LAD in 02/2014 with moderate disease of the proximal LAD and RCA.  She underwent repeat LHC in 12/2015 with nonobstructive CAD and FFR proximal LAD showing patent stent.  She underwent ischemic evaluation with Lexiscan Myoview that showed no evidence of ischemia.  She was started on carvedilol  3.125 mg at initial visit and was not on diuretics for lymphedema but was using pump for management of swelling.  She developed lethargy with carvedilol  and was switched to atenolol  but was later discontinued.  She completed an updated TTE on 09/09/2023 that showed EF of 60-65% with mild LVH and normal RV with trivial MVR.  She was last seen on 12/06/2023 and endorsed more dyspnea and lymphedema but was not on diuretic at that time but was suggested to start.  She is currently followed by the lymphedema clinic and was started on Lasix  20 mg daily.  She had an updated 2D echo on 12/24/2023 that showed stable EF of 60 to 65% with no RWMA and mild concentric LVH with grade 1 DD and very mild AVS found to not be a contributor to shortness of breath.  She was advised to follow-up with PCP regarding swelling and shortness of breath.  Ms. Katelyn Estes presents today for complaint of shortness  of breath and lymphedema. She has been experiencing persistent shortness of breath since her last visit on August 25th, occurring with minimal exertion such as walking short distances. She describes herself as a 'mouth breather' and gets 'out of breath' easily. She has not seen a pulmonologist recently but recalls seeing one a long time ago. She is on Lasix  for lymphedema management, which she takes intermittently due to concerns about frequent urination interfering with her daily activities. She took Lasix  yesterday but is unsure if it helped with her shortness of breath. She tries to take it around 10 AM or noon, avoiding early morning doses. She experiences swelling in her legs and reports a history of lymphedema, which she believes may be related to a previous operation.  She is attempting to reduce salt intake, despite her preference for salty foods. She experiences sciatic pain, particularly when driving, which she attributes to long car rides in the past. Prolonged sitting aggravates her sciatic pain. She drinks less than the recommended amount of water, consuming only two to three small bottles a day, and acknowledges the need to increase her fluid intake to prevent dehydration, especially while on Lasix . She enjoys playing cards at the senior center but often forgets to drink water while engaged in activities Patient denies chest pain, palpitations, PND, orthopnea, nausea, vomiting, dizziness, syncope, edema, weight gain, or early satiety.  Discussed the use of AI scribe software for  clinical note transcription with the patient, who gave verbal consent to proceed.  History of Present Illness   Review of Systems  Please see the history of present illness.    All other systems reviewed and are otherwise negative except as noted above.  Physical Exam    Wt Readings from Last 3 Encounters:  12/06/23 197 lb (89.4 kg)  10/22/23 195 lb 9.6 oz (88.7 kg)  08/30/23 191 lb 6.4 oz (86.8 kg)   CD:Uyzmz  were no vitals filed for this visit.,There is no height or weight on file to calculate BMI. GEN: Well nourished, well developed in no acute distress Neck: No JVD; No carotid bruits Pulmonary: Clear to auscultation without rales, wheezing or rhonchi  Cardiovascular: Normal rate. Regular rhythm. Normal S1. Normal S2.   Murmurs: There is no murmur.  ABDOMEN: Soft, non-tender, non-distended EXTREMITIES: Bilateral +3 pitting edema of lower extremities  EKG/LABS/ Recent Cardiac Studies   ECG personally reviewed by me today -none completed today  Risk Assessment/Calculations:        Lab Results  Component Value Date   WBC 4.4 10/22/2023   HGB 13.2 10/22/2023   HCT 40.3 10/22/2023   MCV 96.9 10/22/2023   PLT 250.0 10/22/2023   Lab Results  Component Value Date   CREATININE 0.62 12/31/2023   BUN 11 12/31/2023   NA 143 12/31/2023   K 4.2 12/31/2023   CL 107 (H) 12/31/2023   CO2 23 12/31/2023   Lab Results  Component Value Date   CHOL 204 (H) 10/12/2022   HDL 78.80 10/12/2022   LDLCALC 105 (H) 10/12/2022   TRIG 100.0 10/12/2022   CHOLHDL 3 10/12/2022    Lab Results  Component Value Date   HGBA1C 6.0 10/22/2023   Assessment & Plan    Assessment & Plan  1.  History of CAD: - s/p PCI of the mid LAD in 02/2014 with moderate disease of the proximal LAD and RCA and most recent ischemic evaluation completed by [facility] that was low risk and normal - Reports occasional fleeting chest discomfort but denies any chest pain with shortness of breath. - Continue ASA 81 mg.  Nitrostat  0.4 mg  2.  Essential HTN: - Patient's blood pressure today was well-controlled at 122/72 - Continue low-sodium heart healthy diet  3.  Hyperlipidemia: - Patient's LDL cholesterol was 105 - No recommendation for statin therapy due to advanced age  3.  Chronic lymphedema: - Bilateral lower extremity lymphedema treated with ready wraps and followed by PT Chronic lymphedema with significant  swelling and skin changes, likely exacerbated by high salt intake and insufficient fluid drainage. Possible relation to previous surgery or genetic factors. Difficulty with consistent use of pressure pump and compression stockings. - Start Lasix  daily to reduce fluid retention. - Monitor and reduce salt intake. - Elevate legs when sitting to aid fluid drainage. - Use compression stockings consistently. - Schedule follow-up in one week to check kidney function and potassium levels.  5.  HFpEF/dyspnea: - 2D echo completed on 12/24/2023 showing stable EF of 60 to 65% with grade 1 DD and no RWMA with very mild aortic stenosis with gradient of 13 mmHg -Persistent dyspnea possibly related to fluid overload from lymphedema and mild aortic stenosis.  - Refer for PFT's - Take Lasix  daily to manage fluid overload. - Increase hydration to prevent dehydration from diuretic use. - Monitor for signs of dehydration and ensure adequate potassium intake.  6. Aortic stenosis, mild: Mild aortic stenosis with no significant  impact on heart function or contribution to dyspnea. Condition monitored, diuretic therapy used to manage fluid retention. - Continue monitoring heart function and symptoms. - Use Lasix  as prescribed to manage fluid retention.  Disposition: Follow-up with Maude Emmer, MD or APP in 3 months    Signed, Wyn Raddle, Jackee Shove, NP 01/11/2024, 8:52 AM [facility]

## 2024-01-11 NOTE — Progress Notes (Signed)
  Subjective:  Patient ID: Katelyn Estes, female    DOB: 1931-02-24,  MRN: [id number]  89 y.o. female presents to clinic with painful elongated mycotic toenails 1-5 bilaterally which are tender when wearing enclosed shoe gear. Pain is relieved with periodic professional debridement. Chief Complaint  Patient presents with   RFC    RFC no calluses. not diabetic. No anti coag    New problem(s): None   PCP is Kennyth Worth HERO, MD.  No Known Allergies  Review of Systems: Negative except as noted in the HPI.   Objective:  Katelyn Estes is a pleasant 89 y.o. female WD, WN in NAD.Katelyn Estes  Vascular Examination: Vascular status intact b/l with palpable pedal pulses. CFT immediate b/l. No pain with calf compression b/l. Skin temperature gradient WNL b/l. Pedal hair absent. Lymphedema present BLE.  Neurological Examination: Sensation grossly intact b/l with 10 gram monofilament. Vibratory sensation intact b/l.   Dermatological Examination: Pedal skin with normal turgor, texture and tone b/l. Toenails 1-5 b/l thick greater than 3mm, discolored, elongated with subungual debris and pain on dorsal palpation. No hyperkeratotic lesions noted b/l.   Musculoskeletal Examination: Muscle strength 5/5 to b/l LE.  No crepitation noted.  No gross osseous deformities bilaterally however difficulty with shoe gear due to the amount of edema.  Radiographs: None  Last A1c:      Latest Ref Rng & Units 10/22/2023   10:58 AM 04/22/2023    3:00 PM  Hemoglobin A1C  Hemoglobin-A1c 4.6 - 6.5 % 6.0  6.1      Assessment:   1. Pain due to onychomycosis of toenails of both feet   2. Lymphedema     Plan:  Patient was evaluated and treated. All patient's and/or POA's questions/concerns addressed on today's visit. Toenails 1-5 debrided in length and girth without incident. Continue soft, supportive shoe gear daily. Report any pedal injuries to medical professional. Call office if there are any  questions/concerns. -Patient/POA to call should there be question/concern in the interim.  Return in about 10 weeks (around 03/17/2024) for Routine Foot Care.  Ethan LITTIE Saddler, DPM

## 2024-01-12 ENCOUNTER — Encounter: Payer: Self-pay | Admitting: Nurse Practitioner

## 2024-01-12 ENCOUNTER — Other Ambulatory Visit: Payer: Self-pay

## 2024-01-12 ENCOUNTER — Ambulatory Visit: Attending: Internal Medicine | Admitting: Nurse Practitioner

## 2024-01-12 VITALS — BP 122/72 | HR 85 | Ht 60.0 in | Wt 198.0 lb

## 2024-01-12 DIAGNOSIS — I890 Lymphedema, not elsewhere classified: Secondary | ICD-10-CM | POA: Insufficient documentation

## 2024-01-12 DIAGNOSIS — I25119 Atherosclerotic heart disease of native coronary artery with unspecified angina pectoris: Secondary | ICD-10-CM | POA: Diagnosis present

## 2024-01-12 DIAGNOSIS — E782 Mixed hyperlipidemia: Secondary | ICD-10-CM | POA: Insufficient documentation

## 2024-01-12 DIAGNOSIS — R0602 Shortness of breath: Secondary | ICD-10-CM | POA: Diagnosis present

## 2024-01-12 DIAGNOSIS — I10 Essential (primary) hypertension: Secondary | ICD-10-CM | POA: Diagnosis not present

## 2024-01-12 NOTE — Patient Instructions (Addendum)
 Medication Instructions:  START Taking Lasix  daily *If you need a refill on your cardiac medications before your next appointment, please call your pharmacy*  Lab Work: BMET IN 1 WEEK (01/19/2024) If you have labs (blood work) drawn today and your tests are completely normal, you will receive your results only by: MyChart Message (if you have MyChart) OR A paper copy in the mail If you have any lab test that is abnormal or we need to change your treatment, we will call you to review the results.  Testing/Procedures: Your physician has recommended that you have a pulmonary function test. Pulmonary Function Tests are a group of tests that measure how well air moves in and out of your lungs.   Follow-Up: At [facility], you and your health needs are our priority.  As part of our continuing mission to provide you with exceptional heart care, our providers are all part of one team.  This team includes your primary Cardiologist (physician) and Advanced Practice Providers or APPs (Physician Assistants and Nurse Practitioners) who all work together to provide you with the care you need, when you need it.  Your next appointment:   3 month(s)  Provider:   Maude Emmer, MD or APP   We recommend signing up for the patient portal called MyChart.  Sign up information is provided on this After Visit Summary.  MyChart is used to connect with patients for Virtual Visits (Telemedicine).  Patients are able to view lab/test results, encounter notes, upcoming appointments, etc.  Non-urgent messages can be sent to your provider as well.   To learn more about what you can do with MyChart, go to [email or web address].   Other Instructions Ensure you are consuming 64 ounces of water daily.  We recommend that you decrease your intake of salt.  1. Use Morton's Salt substitute.   2. Use Ms. Dash   3. Mccormick;s salt free seasoning   4. Avoid processed meat - bacon ( including [place]  bacon) , sausage, ham, hot dogs, Spam  5. Avoid [place] soup 6. Avoid prepared meals 7. Eat fresh or frozen foods that specifically do not have any added salt.  2 Gram Low Sodium Diet A 2 gram sodium diet restricts the amount of sodium in the diet to no more than 2 g or 2000 mg daily. Limiting the amount of sodium is often used to help lower blood pressure. It is important if you have heart, liver, or kidney problems. Many foods contain sodium for flavor and sometimes as a preservative. When the amount of sodium in a diet needs to be low, it is important to know what to look for when choosing foods and drinks. The following includes some information and guidelines to help make it easier for you to adapt to a low sodium diet.  QUICK TIPS Do not add salt to food.  Avoid convenience items and fast food.  Choose unsalted snack foods.  Buy lower sodium products, often labeled as lower sodium or no salt added.  Check food labels to learn how much sodium is in 1 serving.  When eating at a restaurant, ask that your food be prepared with less salt or none, if possible.   READING FOOD LABELS FOR SODIUM INFORMATION The nutrition facts label is a good place to find how much sodium is in foods. Look for products with no more than 500 to 600 mg of sodium per meal and no more than 150 mg per serving. Remember that 2 g =  2000 mg.  The food label may also list foods as: Sodium-free: Less than 5 mg in a serving.  Very low sodium: 35 mg or less in a serving.  Low-sodium: 140 mg or less in a serving.  Light in sodium: 50% less sodium in a serving. For example, if a food that usually has 300 mg of sodium is changed to become light in sodium, it will have 150 mg of sodium.  Reduced sodium: 25% less sodium in a serving. For example, if a food that usually has 400 mg of sodium is changed to reduced sodium, it will have 300 mg of sodium.   CHOOSING FOODS Grains Avoid: Salted crackers and snack items. Some  cereals, including instant hot cereals. Bread stuffing and biscuit mixes. Seasoned rice or pasta mixes.  Choose: Unsalted snack items. Low-sodium cereals, oats, puffed wheat and rice, shredded wheat. English muffins and bread. Pasta.  Meats Avoid: Salted, canned, smoked, spiced, pickled meats, including fish and poultry. Bacon, ham, sausage, cold cuts, hot dogs, anchovies.  Choose: Low-sodium canned tuna and salmon. Fresh or frozen meat, poultry, and fish.  Dairy Avoid: Processed cheese and spreads. Cottage cheese. Buttermilk and condensed milk. Regular cheese.  Choose: Milk. Low-sodium cottage cheese. Yogurt. Sour cream. Low-sodium cheese.  Fruits and Vegetables Avoid: Regular canned vegetables. Regular canned tomato sauce and paste. Frozen vegetables in sauces. Olives. Dene. Relishes. Sauerkraut.  Choose: Low-sodium canned vegetables. Low-sodium tomato sauce and paste. Frozen or fresh vegetables. Fresh and frozen fruit.  Condiments Avoid: Canned and packaged gravies. Worcestershire sauce. Tartar sauce. Barbecue sauce. Soy sauce. Steak sauce. Ketchup. Onion, garlic, and table salt. Meat flavorings and tenderizers.  Choose: Fresh and dried herbs and spices. Low-sodium varieties of mustard and ketchup. Lemon juice. Tabasco sauce. Horseradish.  SAMPLE 2 GRAM SODIUM MEAL PLAN Breakfast / Sodium (mg) 1 cup low-fat milk / 143 mg  2 slices whole-wheat toast / 270 mg  1 tbs heart-healthy margarine / 153 mg  1 hard-boiled egg / 139 mg  1 small orange / 0 mg  Lunch / Sodium (mg) 1 cup raw carrots / 76 mg   cup hummus / 298 mg  1 cup low-fat milk / 143 mg   cup red grapes / 2 mg  1 whole-wheat pita bread / 356 mg  Dinner / Sodium (mg) 1 cup whole-wheat pasta / 2 mg  1 cup low-sodium tomato sauce / 73 mg  3 oz lean ground beef / 57 mg  1 small side salad (1 cup raw spinach leaves,  cup cucumber,  cup yellow bell pepper) with 1 tsp olive oil and 1 tsp red wine vinegar / 25 mg  Snack /  Sodium (mg) 1 container low-fat vanilla yogurt / 107 mg  3 graham cracker squares / 127 mg  Nutrient Analysis Calories: 2033  Protein: 77 g  Carbohydrate: 282 g  Fat: 72 g  Sodium: 1971 mg  Document Released: 03/30/2005 Document Revised: 03/19/2011 Document Reviewed: 07/01/2009 [facility] Patient Information 2012 [place], [place].

## 2024-02-17 NOTE — Progress Notes (Signed)
 CARDIOLOGY CONSULT NOTE       Patient ID: Katelyn Estes MRN: [id number] DOB/AGE: [birth year] 89 y.o.  Referring Physician: Kennyth Primary Physician: Kennyth Worth HERO, MD Primary Cardiologist: Delford Reason for Consultation: CAD/Lymphedema    HPI:  89 y.o. referred by Dr Kennyth for CAD. First seen by me 02/18/21 She has chronic lymphedema using pump since 2016 and has seen Dr Harden , wound center and oncology lymphedema center Reviewed notes from [place]  Heart History of stent to mid LAD.  Last cath 2017 with patent stent and moderate dx in proximal LAD and proximal RCA. FFR negative stent patent March 2021 lexiscan myovue normal Echo's have shown normal EF and trace MR. Chronic LE edema, dyspnea and atypical sharp chest pains better with Ranexa Tends to be bradycardic previous monitor with no AV block and self limited SVT. She is overweight and has OSA with recent use of CPAP  No angina Some trigeminal neuralgia and Insomnia   Use to work for [employer] in [employer] Widowed over 45 years Has 4 daughters 3 in [place] And moved to town home here. Having trouble getting socially integrated and finding People to play cards with   Lethargy with coreg  was on atenolol  and d/c   She lives at the [place] a few houses down from my father in law  Going to PT for her multiple shoulder, leg, back pains Voltaren helps   Neuropathy in legs  Seen by podiatry 10/26/23 and Rx for cellulitis with doxy.   Updated TTE 12/24/23 EF 60-65% Normal RV mild AS mean gradient 13 mmhg  She has had more dyspnea and lymphedema worse. Doesn't like her machine. Not on diuretic and I suggested she should be Started lasix  20 mg daily   She still does not take diuretic regularly as she likes to go to senior center and play cards  ROS All other systems reviewed and negative except as noted above  Past Medical History:  Diagnosis Date   Arthritis    Cataracts, bilateral    Congestive heart disease (HCC)     Family History   Problem Relation Age of Onset   Diabetes Son    BRCA 1/2 Neg Hx    Breast cancer Neg Hx     Social History   Socioeconomic History   Marital status: Widowed    Spouse name: Not on file   Number of children: Not on file   Years of education: Not on file   Highest education level: Associate degree: academic program  Occupational History   Not on file  Tobacco Use   Smoking status: Never   Smokeless tobacco: Never  Vaping Use   Vaping status: Never Used  Substance and Sexual Activity   Alcohol use: Yes    Comment: occ   Drug use: Never   Sexual activity: Not on file  Other Topics Concern   Not on file  Social History Narrative   Not on file   Social Drivers of Health   Financial Resource Strain: Patient Declined (07/22/2023)   Overall Financial Resource Strain (CARDIA)    Difficulty of Paying Living Expenses: Patient declined  Food Insecurity: No Food Insecurity (07/26/2023)   Hunger Vital Sign    Worried About Running Out of Food in the Last Year: Never true    Ran Out of Food in the Last Year: Never true  Transportation Needs: No Transportation Needs (07/22/2023)   PRAPARE - Administrator, Civil Service (Medical): No  Lack of Transportation (Non-Medical): No  Physical Activity: Insufficiently Active (07/26/2023)   Exercise Vital Sign    Days of Exercise per Week: 1 day    Minutes of Exercise per Session: 50 min  Stress: No Stress Concern Present (07/26/2023)   [employer] of Occupational Health - Occupational Stress Questionnaire    Feeling of Stress : Not at all  Social Connections: Moderately Integrated (07/22/2023)   Social Connection and Isolation Panel    Frequency of Communication with Friends and Family: More than three times a week    Frequency of Social Gatherings with Friends and Family: More than three times a week    Attends Religious Services: More than 4 times per year    Active Member of [employer] or Organizations: Yes    Attends Occupational Hygienist Meetings: More than 4 times per year    Marital Status: Widowed  Intimate Partner Violence: Not At Risk (07/26/2023)   Humiliation, Afraid, Rape, and Kick questionnaire    Fear of Current or Ex-Partner: No    Emotionally Abused: No    Physically Abused: No    Sexually Abused: No    Past Surgical History:  Procedure Laterality Date   BACK SURGERY  2005   BREAST BIOPSY     30+/many years ago   REPLACEMENT TOTAL KNEE BILATERAL  2003   TONSILLECTOMY  1939      Current Outpatient Medications:    ammonium lactate  (LAC-HYDRIN ) 12 % lotion, Apply to both feet up to twice daily. Avoid application between toes., Disp: 400 g, Rfl: 5   aspirin  EC 81 MG tablet, Take 1 tablet (81 mg total) by mouth daily. Swallow whole., Disp: , Rfl:    Calcium Carbonate (CALCIUM 500 PO), Take by mouth., Disp: , Rfl:    furosemide  (LASIX ) 20 MG tablet, Take 1 tablet (20 mg total) by mouth daily., Disp: 90 tablet, Rfl: 3   Multiple Vitamin (MULTIVITAMIN) tablet, Take 1 tablet by mouth daily., Disp: , Rfl:    nitroGLYCERIN  (NITROSTAT ) 0.4 MG SL tablet, PLACE 1 TABLET UNDER THE TONGUE EVERY 5 MINUTES AS NEEDED FOR CHEST PAIN. (Patient taking differently: On hand), Disp: 25 tablet, Rfl: 4   VITAMIN D , CHOLECALCIFEROL, PO, Take 1 tablet by mouth daily., Disp: , Rfl:     Physical Exam: There were no vitals taken for this visit.    Affect appropriate Overweight elderly female  HEENT: normal Neck supple with no adenopathy JVP normal no bruits no thyromegaly Lungs clear with no wheezing and good diaphragmatic motion Heart:  S1/S2 SEM mild AS  murmur, no rub, gallop or click PMI normal Abdomen: benighn, BS positve, no tenderness, no AAA no bruit.  No HSM or HJR Distal pulses intact with no bruits Bilateral lymphedema prominent over feet right worse than left  Post bilateral TKR;s  Neuro non-focal Skin warm and dry No muscular weakness   Labs:   Lab Results  Component Value Date   WBC 4.4  10/22/2023   HGB 13.2 10/22/2023   HCT 40.3 10/22/2023   MCV 96.9 10/22/2023   PLT 250.0 10/22/2023   No results for input(s): NA, K, CL, CO2, BUN, CREATININE, CALCIUM, PROT, BILITOT, ALKPHOS, ALT, AST, GLUCOSE in the last 168 hours.  Invalid input(s): LABALBU No results found for: CKTOTAL, CKMB, CKMBINDEX, TROPONINI  Lab Results  Component Value Date   CHOL 204 (H) 10/12/2022   CHOL 202 (H) 04/15/2022   CHOL 202 (H) 10/15/2021   Lab Results  Component Value Date  HDL 78.80 10/12/2022   HDL 86.60 04/15/2022   HDL 79.50 10/15/2021   Lab Results  Component Value Date   LDLCALC 105 (H) 10/12/2022   LDLCALC 91 04/15/2022   LDLCALC 103 (H) 10/15/2021   Lab Results  Component Value Date   TRIG 100.0 10/12/2022   TRIG 122.0 04/15/2022   TRIG 98.0 10/15/2021   Lab Results  Component Value Date   CHOLHDL 3 10/12/2022   CHOLHDL 2 04/15/2022   CHOLHDL 3 10/15/2021   No results found for: LDLDIRECT    Radiology: No results found.  EKG: SR rate 82 RBBB/ LAD no change 02/17/2024    ASSESSMENT AND PLAN:   CAD: stent to mid LAD with non ischemic myovue March 2021 Historically on plavix monotherapy but changed to 81 mg ASA Lymphedema:  Sees lymphedema clinic has pump will f/u with wound center BMET normal 10/22/23 Lasix  20 mg daily Normal RV/LV function on TTE 12/24/23   OSA:  discussed weight loss and CPAP Bradycardia:  history of Tachycardic today see above regarding low dose atenolol   HLD:  LDL 105  f/u primary defers statin given age  Dyspnea: related to deconditioning/obesity and lymphedema RV/LV function normal on TTE 12/24/23  BNP normal 12/31/23  AS:  mild on TTE 12/2023 mean gradient only 13 mmHg consider f/u echo in 2-3 years or change in murmur    F/U in  a year   Signed: Maude Emmer 02/17/2024, 4:05 PM

## 2024-02-24 ENCOUNTER — Telehealth: Payer: Self-pay | Admitting: Cardiovascular Disease

## 2024-02-24 NOTE — Telephone Encounter (Signed)
 Pt would like to know if she left her drivers license in office

## 2024-02-24 NOTE — Telephone Encounter (Signed)
 Last time she remembers seeing it was when she was here 01/12/24. Advised typically if it had been left here, she would have been contacted and it would have been mailed to her.  She is schedule here tomorrow and will check at the lost and found as well.  Message sent to the triage walk in pool in teams as well for their knowledge.

## 2024-02-24 NOTE — Telephone Encounter (Signed)
 Received notification not in lost and found and not in zone A either.

## 2024-02-25 ENCOUNTER — Encounter: Payer: Self-pay | Admitting: Cardiovascular Disease

## 2024-02-25 ENCOUNTER — Ambulatory Visit: Attending: Cardiovascular Disease | Admitting: Cardiovascular Disease

## 2024-02-25 VITALS — BP 128/68 | HR 86 | Ht 60.0 in | Wt 195.0 lb

## 2024-02-25 DIAGNOSIS — I10 Essential (primary) hypertension: Secondary | ICD-10-CM | POA: Insufficient documentation

## 2024-02-25 DIAGNOSIS — E782 Mixed hyperlipidemia: Secondary | ICD-10-CM | POA: Insufficient documentation

## 2024-02-25 DIAGNOSIS — I5030 Unspecified diastolic (congestive) heart failure: Secondary | ICD-10-CM | POA: Insufficient documentation

## 2024-02-25 DIAGNOSIS — I890 Lymphedema, not elsewhere classified: Secondary | ICD-10-CM | POA: Diagnosis present

## 2024-02-25 DIAGNOSIS — I25119 Atherosclerotic heart disease of native coronary artery with unspecified angina pectoris: Secondary | ICD-10-CM | POA: Insufficient documentation

## 2024-02-25 NOTE — Patient Instructions (Signed)
 Medication Instructions:  NO CHANGES  Lab Work: NONE TO BE DONE TODAY.  Testing/Procedures: NONE  Follow-Up: At [facility], you and your health needs are our priority.  As part of our continuing mission to provide you with exceptional heart care, our providers are all part of one team.  This team includes your primary Cardiologist (physician) and Advanced Practice Providers or APPs (Physician Assistants and Nurse Practitioners) who all work together to provide you with the care you need, when you need it.  Your next appointment:   1 YEAR  Provider:   Maude Emmer, MD

## 2024-03-06 ENCOUNTER — Ambulatory Visit (HOSPITAL_COMMUNITY)
Admission: RE | Admit: 2024-03-06 | Discharge: 2024-03-06 | Disposition: A | Discharge status: Home / Self Care | Source: Ambulatory Visit | Attending: Cardiovascular Disease | Admitting: Cardiovascular Disease

## 2024-03-06 ENCOUNTER — Ambulatory Visit: Payer: Self-pay | Admitting: Cardiovascular Disease

## 2024-03-06 DIAGNOSIS — I10 Essential (primary) hypertension: Secondary | ICD-10-CM | POA: Diagnosis present

## 2024-03-06 DIAGNOSIS — E782 Mixed hyperlipidemia: Secondary | ICD-10-CM | POA: Diagnosis present

## 2024-03-06 DIAGNOSIS — I890 Lymphedema, not elsewhere classified: Secondary | ICD-10-CM | POA: Diagnosis present

## 2024-03-06 DIAGNOSIS — R0602 Shortness of breath: Secondary | ICD-10-CM | POA: Insufficient documentation

## 2024-03-06 DIAGNOSIS — I25119 Atherosclerotic heart disease of native coronary artery with unspecified angina pectoris: Secondary | ICD-10-CM | POA: Insufficient documentation

## 2024-03-06 LAB — PULMONARY FUNCTION TEST
DL/VA % pred: 132 %
DL/VA: 5.27 ml/min/mmHg/L
DLCO unc % pred: 85 %
DLCO unc: 14.98 ml/min/mmHg
FEF 25-75 Pre: 1.06 L/s
FEF2575-%Pred-Pre: 177 %
FEV1-%Pred-Pre: 108 %
FEV1-Pre: 1.27 L
FEV1FVC-%Pred-Pre: 108 %
FEV6-%Pred-Pre: 108 %
FEV6-Pre: 1.62 L
FEV6FVC-%Pred-Pre: 107 %
FVC-%Pred-Pre: 101 %
FVC-Pre: 1.64 L
Pre FEV1/FVC ratio: 77 %
Pre FEV6/FVC Ratio: 99 %
RV % pred: 90 %
RV: 2.19 L
TLC % pred: 88 %
TLC: 3.96 L

## 2024-03-07 LAB — BASIC METABOLIC PANEL WITH GFR
BUN/Creatinine Ratio: 22 (ref 12–28)
BUN: 15 mg/dL (ref 10–36)
CO2: 25 mmol/L (ref 20–29)
Calcium: 9.5 mg/dL (ref 8.7–10.3)
Chloride: 103 mmol/L (ref 96–106)
Creatinine, Ser: 0.69 mg/dL (ref 0.57–1.00)
Glucose: 78 mg/dL (ref 70–99)
Potassium: 4.7 mmol/L (ref 3.5–5.2)
Sodium: 142 mmol/L (ref 134–144)
eGFR: 82 mL/min/1.73 (ref >59)

## 2024-03-16 ENCOUNTER — Ambulatory Visit: Admitting: Podiatry

## 2024-03-16 DIAGNOSIS — B351 Tinea unguium: Secondary | ICD-10-CM | POA: Diagnosis not present

## 2024-03-16 DIAGNOSIS — M79675 Pain in left toe(s): Secondary | ICD-10-CM | POA: Diagnosis not present

## 2024-03-16 DIAGNOSIS — B353 Tinea pedis: Secondary | ICD-10-CM | POA: Diagnosis not present

## 2024-03-16 DIAGNOSIS — M79674 Pain in right toe(s): Secondary | ICD-10-CM | POA: Diagnosis not present

## 2024-03-16 MED ORDER — KETOCONAZOLE 2 % EX CREA: 1.0000 | TOPICAL_CREAM | Freq: Every day | CUTANEOUS | 2 refills | Status: AC

## 2024-03-16 NOTE — Patient Instructions (Addendum)
 You can combine 3 tablespoons of coconut oil and 10-15 drops of tea tree oil together and apply to the toenails once a day, Usually at night time.  I have included some exercises and stretches you can try for your sciatica pain. You may do these to the best of your ability and choose movements and exercises that you can do safely.

## 2024-03-16 NOTE — Progress Notes (Signed)
  Subjective:  Patient ID: Katelyn Estes, female    DOB: 05-Sep-1930,  MRN: [id number]  89 y.o. female presents to clinic with painful elongated mycotic toenails 1-5 bilaterally which are tender when wearing enclosed shoe gear. Pain is relieved with periodic professional debridement.  Also complains of pain associated with sciatica. Chief Complaint  Patient presents with   RFC    RFC  Not diabetic. No anti coag    New problem(s): None   PCP is Kennyth Worth HERO, MD.  No Known Allergies  Review of Systems: Negative except as noted in the HPI.   Objective:  Katelyn Estes is a pleasant 89 y.o. female WD, WN in NAD.Katelyn Estes  Vascular Examination: Vascular status intact b/l with palpable pedal pulses. CFT immediate b/l. No pain with calf compression b/l. Skin temperature gradient WNL b/l. Pedal hair absent. Lymphedema present BLE.  Neurological Examination: Sensation grossly intact b/l with 10 gram monofilament. Vibratory sensation intact b/l.   Dermatological Examination: Pedal skin with normal turgor, texture and tone b/l. Toenails 1-5 b/l thick greater than 3mm, discolored, elongated with subungual debris and pain on dorsal palpation. No hyperkeratotic lesions noted b/l.  There is some dry xerotic pedal skin present bilaterally to plantar feet, mildly erythematous rash.  Musculoskeletal Examination: Muscle strength 5/5 to b/l LE.  No crepitation noted.  No gross osseous deformities bilaterally however difficulty with shoe gear due to the amount of edema.  Radiographs: None  Last A1c:      Latest Ref Rng & Units 10/22/2023   10:58 AM 04/22/2023    3:00 PM  Hemoglobin A1C  Hemoglobin-A1c 4.6 - 6.5 % 6.0  6.1      Assessment:   1. Tinea pedis of both feet   2. Pain due to onychomycosis of toenails of both feet     Plan:  Patient was evaluated and treated. All patient's and/or POA's questions/concerns addressed on today's visit. Toenails 1-5 debrided in length and girth without  incident. Continue soft, supportive shoe gear daily. Report any pedal injuries to medical professional. Call office if there are any questions/concerns. -Patient/POA to call should there be question/concern in the interim.  May apply tea tree oil and coconut oil mixture to the toenails which may improve the appearance of the toenails over time.  Home exercises for sciatica pain were discussed with patient to complete to the best of her ability.  Written instructions dispensed.  # Tinea pedis of both feet Ketoconazole  2% cream sent to patient's pharmacy to be applied to the affected skin.  Discussed importance of pedal hygiene treatment and maintenance of tinea pedis. -I certify that this diagnosis represents a distinct and separate diagnosis that requires evaluation and treatment separate from other procedures or diagnosis    Return in about 9 weeks (around 05/18/2024) for Routine Foot Care.  Ethan LITTIE Saddler, DPM

## 2024-03-20 ENCOUNTER — Encounter: Payer: Self-pay | Admitting: Podiatry

## 2024-03-21 ENCOUNTER — Telehealth: Payer: Self-pay | Admitting: Podiatry

## 2024-03-21 NOTE — Telephone Encounter (Signed)
 Patient called stating she thought Dr. Lamount was prescribing tea tree oil rather than ketoconazole  2% cream. She reported that she had previously been prescribed ketoconazole  2% cream but did not use it at that time.  Instructions regarding the use of tea tree oil from the AVS were reviewed with the patient. She was informed that the tea tree oil is an over-the-counter item she should purchase and use in combination as instructed. She was also advised that these instructions are available in MyChart under the visit date.  Patient requested that Dr. Lamount be made aware that she already had the ketoconazole  2% cream and did not use it when it was first prescribed.

## 2024-04-04 NOTE — Progress Notes (Signed)
 CARDIOLOGY CONSULT NOTE       Patient ID: Katelyn Estes MRN: [id number] DOB/AGE: 06/06/1930 89 y.o.  Referring Physician: Kennyth Primary Physician: Kennyth Worth HERO, MD Primary Cardiologist: Delford Reason for Consultation: CAD/Lymphedema    HPI:  89 y.o. referred by Dr Kennyth for CAD. First seen by me 02/19/87 She has chronic lymphedema using pump since 2016 and has seen Dr Harden , wound center and oncology lymphedema center Reviewed notes from [place]  Heart History of stent to mid LAD.  Last cath 2017 with patent stent and moderate dx in proximal LAD and proximal RCA. FFR negative stent patent March 2021 lexiscan myovue normal Echo's have shown normal EF and trace MR. Chronic LE edema, dyspnea and atypical sharp chest pains better with Ranexa Tends to be bradycardic previous monitor with no AV block and self limited SVT. She is overweight and has OSA with recent use of CPAP  No angina Some trigeminal neuralgia and Insomnia   Use to work for [employer] in [employer] Widowed over 45 years Has 4 daughters 3 in [place] And moved to town home here. Having trouble getting socially integrated and finding People to play cards with   Lethargy with coreg  was on atenolol  and d/c   She lives at the [place] a few houses down from my father in law  Going to PT for her multiple shoulder, leg, back pains Voltaren helps   Neuropathy in legs  Seen by podiatry 7/88/25 and Rx for cellulitis with doxy.   Updated TTE 12/24/86 EF 60-65% Normal RV mild AS mean gradient 13 mmhg  She has had more dyspnea and lymphedema worse. Doesn't like her machine. Not on diuretic and I suggested she should be Started lasix  20 mg daily   She still does not take diuretic regularly as she likes to go to senior center and play cards  Still not taking lasix  daily due to inconvenience and unable to hold urine after taking   ROS All other systems reviewed and negative except as noted above  Past Medical History:  Diagnosis Date    Arthritis    Cataracts, bilateral    Congestive heart disease (HCC)     Family History  Problem Relation Age of Onset   Diabetes Son    BRCA 1/2 Neg Hx    Breast cancer Neg Hx     Social History   Socioeconomic History   Marital status: Widowed    Spouse name: Not on file   Number of children: Not on file   Years of education: Not on file   Highest education level: Associate degree: academic program  Occupational History   Not on file  Tobacco Use   Smoking status: Never   Smokeless tobacco: Never  Vaping Use   Vaping status: Never Used  Substance and Sexual Activity   Alcohol use: Yes    Comment: occ   Drug use: Never   Sexual activity: Not on file  Other Topics Concern   Not on file  Social History Narrative   Not on file   Social Drivers of Health   Tobacco Use: Low Risk (03/20/2024)   Patient History    Smoking Tobacco Use: Never    Smokeless Tobacco Use: Never    Passive Exposure: Not on file  Financial Resource Strain: Patient Declined (07/22/2023)   Overall Financial Resource Strain (CARDIA)    Difficulty of Paying Living Expenses: Patient declined  Food Insecurity: No Food Insecurity (07/26/2023)   Hunger Vital Sign  Worried About Programme Researcher, Broadcasting/film/video in the Last Year: Never true    Ran Out of Food in the Last Year: Never true  Transportation Needs: No Transportation Needs (07/22/2023)   PRAPARE - Administrator, Civil Service (Medical): No    Lack of Transportation (Non-Medical): No  Physical Activity: Insufficiently Active (07/26/2023)   Exercise Vital Sign    Days of Exercise per Week: 1 day    Minutes of Exercise per Session: 50 min  Stress: No Stress Concern Present (07/26/2023)   [employer] of Occupational Health - Occupational Stress Questionnaire    Feeling of Stress : Not at all  Social Connections: Moderately Integrated (07/22/2023)   Social Connection and Isolation Panel    Frequency of Communication with Friends and Family:  More than three times a week    Frequency of Social Gatherings with Friends and Family: More than three times a week    Attends Religious Services: More than 4 times per year    Active Member of [employer] or Organizations: Yes    Attends Banker Meetings: More than 4 times per year    Marital Status: Widowed  Intimate Partner Violence: Not At Risk (07/26/2023)   Humiliation, Afraid, Rape, and Kick questionnaire    Fear of Current or Ex-Partner: No    Emotionally Abused: No    Physically Abused: No    Sexually Abused: No  Depression (PHQ2-9): Low Risk (10/22/2023)   Depression (PHQ2-9)    PHQ-2 Score: 0  Alcohol Screen: Not on file  Housing: Unknown (07/22/2023)   Housing Stability Vital Sign    Unable to Pay for Housing in the Last Year: No    Number of Times Moved in the Last Year: Not on file    Homeless in the Last Year: No  Utilities: Not At Risk (07/26/2023)   AHC Utilities    Threatened with loss of utilities: No  Health Literacy: Adequate Health Literacy (07/26/2023)   B1300 Health Literacy    Frequency of need for help with medical instructions: Never    Past Surgical History:  Procedure Laterality Date   BACK SURGERY  2005   BREAST BIOPSY     30+/many years ago   REPLACEMENT TOTAL KNEE BILATERAL  2003   TONSILLECTOMY  1939      Current Outpatient Medications:    ammonium lactate  (LAC-HYDRIN ) 12 % lotion, Apply to both feet up to twice daily. Avoid application between toes. (Patient taking differently: Apply 1 Application topically as needed for dry skin. Apply to both feet up to twice daily. Avoid application between toes.), Disp: 400 g, Rfl: 5   aspirin  EC 81 MG tablet, Take 1 tablet (81 mg total) by mouth daily. Swallow whole., Disp: , Rfl:    Calcium Carbonate (CALCIUM 500 PO), Take by mouth., Disp: , Rfl:    ketoconazole  (NIZORAL ) 2 % cream, Apply 1 Application topically daily., Disp: 60 g, Rfl: 2   Multiple Vitamin (MULTIVITAMIN) tablet, Take 1 tablet by  mouth daily., Disp: , Rfl:    nitroGLYCERIN  (NITROSTAT ) 0.4 MG SL tablet, PLACE 1 TABLET UNDER THE TONGUE EVERY 5 MINUTES AS NEEDED FOR CHEST PAIN. (Patient taking differently: On hand), Disp: 25 tablet, Rfl: 4   VITAMIN D , CHOLECALCIFEROL, PO, Take 1 tablet by mouth daily., Disp: , Rfl:    furosemide  (LASIX ) 20 MG tablet, Take 1 tablet (20 mg total) by mouth daily. (Patient not taking: Reported on 04/11/2024), Disp: 90 tablet, Rfl: 3  Physical Exam: Blood pressure 130/86, pulse 86, height 5' (1.524 m), weight 200 lb (90.7 kg), SpO2 95%.    Affect appropriate Overweight elderly female  HEENT: normal Neck supple with no adenopathy JVP normal no bruits no thyromegaly Lungs clear with no wheezing and good diaphragmatic motion Heart:  S1/S2 SEM mild AS  murmur, no rub, gallop or click PMI normal Abdomen: benighn, BS positve, no tenderness, no AAA no bruit.  No HSM or HJR Distal pulses intact with no bruits Bilateral lymphedema prominent over feet right worse than left plus 3 Post bilateral TKR;s  Neuro non-focal Skin warm and dry No muscular weakness   Labs:   Lab Results  Component Value Date   WBC 4.4 10/22/2023   HGB 13.2 10/22/2023   HCT 40.3 10/22/2023   MCV 96.9 10/22/2023   PLT 250.0 10/22/2023   No results for input(s): NA, K, CL, CO2, BUN, CREATININE, CALCIUM, PROT, BILITOT, ALKPHOS, ALT, AST, GLUCOSE in the last 168 hours.  Invalid input(s): LABALBU No results found for: CKTOTAL, CKMB, CKMBINDEX, TROPONINI  Lab Results  Component Value Date   CHOL 204 (H) 10/12/2022   CHOL 202 (H) 04/15/2022   CHOL 202 (H) 10/15/2021   Lab Results  Component Value Date   HDL 78.80 10/12/2022   HDL 86.60 04/15/2022   HDL 79.50 10/15/2021   Lab Results  Component Value Date   LDLCALC 105 (H) 10/12/2022   LDLCALC 91 04/15/2022   LDLCALC 103 (H) 10/15/2021   Lab Results  Component Value Date   TRIG 100.0 10/12/2022   TRIG 122.0  04/15/2022   TRIG 98.0 10/15/2021   Lab Results  Component Value Date   CHOLHDL 3 10/12/2022   CHOLHDL 2 04/15/2022   CHOLHDL 3 10/15/2021   No results found for: LDLDIRECT    Radiology: No results found.  EKG: SR rate 82 RBBB/ LAD no change 04/11/2024    ASSESSMENT AND PLAN:   CAD: stent to mid LAD with non ischemic myovue March 2021 Historically on plavix monotherapy but changed to 81 mg ASA Lymphedema:  Sees lymphedema clinic has pump will f/u with wound center BMET normal 10/22/23 Lasix  20 mg daily Normal RV/LV function on TTE 12/24/23   OSA:  discussed weight loss and CPAP Bradycardia:  history of Tachycardic today see above regarding low dose atenolol   HLD:  LDL 105  f/u primary defers statin given age  Dyspnea: related to deconditioning/obesity and lymphedema RV/LV function normal on TTE 12/24/23  BNP normal 12/31/23  AS:  mild on TTE 12/2023 mean gradient only 13 mmHg consider f/u echo in 2-3 years or change in murmur    F/U in  a year   Signed: Maude Emmer 04/11/2024, 3:53 PM

## 2024-04-11 ENCOUNTER — Ambulatory Visit: Admitting: Cardiovascular Disease

## 2024-04-11 VITALS — BP 130/86 | HR 86 | Ht 60.0 in | Wt 200.0 lb

## 2024-04-11 DIAGNOSIS — R011 Cardiac murmur, unspecified: Secondary | ICD-10-CM | POA: Diagnosis not present

## 2024-04-11 DIAGNOSIS — I5030 Unspecified diastolic (congestive) heart failure: Secondary | ICD-10-CM | POA: Diagnosis not present

## 2024-04-11 DIAGNOSIS — I10 Essential (primary) hypertension: Secondary | ICD-10-CM | POA: Diagnosis not present

## 2024-04-11 DIAGNOSIS — I890 Lymphedema, not elsewhere classified: Secondary | ICD-10-CM | POA: Insufficient documentation

## 2024-04-11 DIAGNOSIS — I25119 Atherosclerotic heart disease of native coronary artery with unspecified angina pectoris: Secondary | ICD-10-CM | POA: Insufficient documentation

## 2024-04-11 NOTE — Patient Instructions (Addendum)
 Medication Instructions:  No medication changes were made at this visit. Continue current regimen.   *If you need a refill on your cardiac medications before your next appointment, please call your pharmacy*  Lab Work: None ordered today. If you have labs (blood work) drawn today and your tests are completely normal, you will receive your results only by: MyChart Message (if you have MyChart) OR A paper copy in the mail If you have any lab test that is abnormal or we need to change your treatment, we will call you to review the results.  Testing/Procedures: None ordered today.  Follow-Up: At [facility], you and your health needs are our priority.  As part of our continuing mission to provide you with exceptional heart care, our providers are all part of one team.  This team includes your primary Cardiologist (physician) and Advanced Practice Providers or APPs (Physician Assistants and Nurse Practitioners) who all work together to provide you with the care you need, when you need it.  Your next appointment:   6 months  Provider:   Maude Emmer, MD

## 2024-04-24 ENCOUNTER — Encounter: Payer: Self-pay | Admitting: Family Medicine

## 2024-04-24 ENCOUNTER — Ambulatory Visit: Admitting: Family Medicine

## 2024-04-24 VITALS — BP 112/80 | HR 85 | Temp 97.1°F | Ht 60.0 in | Wt 193.0 lb

## 2024-04-24 DIAGNOSIS — I10 Essential (primary) hypertension: Secondary | ICD-10-CM

## 2024-04-24 DIAGNOSIS — G4733 Obstructive sleep apnea (adult) (pediatric): Secondary | ICD-10-CM | POA: Diagnosis not present

## 2024-04-24 DIAGNOSIS — R5381 Other malaise: Secondary | ICD-10-CM | POA: Diagnosis not present

## 2024-04-24 DIAGNOSIS — E559 Vitamin D deficiency, unspecified: Secondary | ICD-10-CM | POA: Diagnosis not present

## 2024-04-24 DIAGNOSIS — I110 Hypertensive heart disease with heart failure: Secondary | ICD-10-CM | POA: Diagnosis not present

## 2024-04-24 DIAGNOSIS — L659 Nonscarring hair loss, unspecified: Secondary | ICD-10-CM | POA: Diagnosis not present

## 2024-04-24 DIAGNOSIS — R5383 Other fatigue: Secondary | ICD-10-CM

## 2024-04-24 DIAGNOSIS — I5030 Unspecified diastolic (congestive) heart failure: Secondary | ICD-10-CM | POA: Insufficient documentation

## 2024-04-24 DIAGNOSIS — I890 Lymphedema, not elsewhere classified: Secondary | ICD-10-CM | POA: Diagnosis not present

## 2024-04-24 DIAGNOSIS — E785 Hyperlipidemia, unspecified: Secondary | ICD-10-CM

## 2024-04-24 DIAGNOSIS — R739 Hyperglycemia, unspecified: Secondary | ICD-10-CM | POA: Diagnosis not present

## 2024-04-24 DIAGNOSIS — L299 Pruritus, unspecified: Secondary | ICD-10-CM

## 2024-04-24 DIAGNOSIS — M810 Age-related osteoporosis without current pathological fracture: Secondary | ICD-10-CM

## 2024-04-24 LAB — COMPREHENSIVE METABOLIC PANEL WITH GFR
ALT: 10 U/L (ref 3–35)
AST: 15 U/L (ref 5–37)
Albumin: 4 g/dL (ref 3.5–5.2)
Alkaline Phosphatase: 51 U/L (ref 39–117)
BUN: 19 mg/dL (ref 6–23)
CO2: 28 meq/L (ref 19–32)
Calcium: 9.1 mg/dL (ref 8.4–10.5)
Chloride: 106 meq/L (ref 96–112)
Creatinine, Ser: 0.62 mg/dL (ref 0.40–1.20)
GFR: 77.81 mL/min
Glucose, Bld: 93 mg/dL (ref 70–99)
Potassium: 3.9 meq/L (ref 3.5–5.1)
Sodium: 142 meq/L (ref 135–145)
Total Bilirubin: 0.5 mg/dL (ref 0.2–1.2)
Total Protein: 6.5 g/dL (ref 6.0–8.3)

## 2024-04-24 LAB — CBC WITH DIFFERENTIAL/PLATELET
Basophils Absolute: 0 K/uL (ref 0.0–0.1)
Basophils Relative: 0.9 % (ref 0.0–3.0)
Eosinophils Absolute: 0.1 K/uL (ref 0.0–0.7)
Eosinophils Relative: 3.6 % (ref 0.0–5.0)
HCT: 40 % (ref 36.0–46.0)
Hemoglobin: 13.5 g/dL (ref 12.0–15.0)
Lymphocytes Relative: 31.1 % (ref 12.0–46.0)
Lymphs Abs: 1.2 K/uL (ref 0.7–4.0)
MCHC: 33.8 g/dL (ref 30.0–36.0)
MCV: 95.2 fl (ref 78.0–100.0)
Monocytes Absolute: 0.3 K/uL (ref 0.1–1.0)
Monocytes Relative: 8.5 % (ref 3.0–12.0)
Neutro Abs: 2.2 K/uL (ref 1.4–7.7)
Neutrophils Relative %: 55.9 % (ref 43.0–77.0)
Platelets: 260 K/uL (ref 150.0–400.0)
RBC: 4.2 Mil/uL (ref 3.87–5.11)
RDW: 14.2 % (ref 11.5–15.5)
WBC: 4 K/uL (ref 4.0–10.5)

## 2024-04-24 LAB — TSH: TSH: 2.09 u[IU]/mL (ref 0.35–5.50)

## 2024-04-24 LAB — IBC + FERRITIN
Ferritin: 36.5 ng/mL (ref 10.0–291.0)
Iron: 135 ug/dL (ref 42–145)
Saturation Ratios: 34 % (ref 20.0–50.0)
TIBC: 397.6 ug/dL (ref 250.0–450.0)
Transferrin: 284 mg/dL (ref 212.0–360.0)

## 2024-04-24 LAB — HEMOGLOBIN A1C: Hgb A1c MFr Bld: 5.9 % (ref 4.6–6.5)

## 2024-04-24 LAB — FOLATE: Folate: 17.4 ng/mL

## 2024-04-24 LAB — VITAMIN B12: Vitamin B-12: 236 pg/mL (ref 211–911)

## 2024-04-24 LAB — VITAMIN D 25 HYDROXY (VIT D DEFICIENCY, FRACTURES): VITD: 33.64 ng/mL (ref 30.00–100.00)

## 2024-04-24 LAB — MAGNESIUM: Magnesium: 1.9 mg/dL (ref 1.5–2.5)

## 2024-04-24 NOTE — Assessment & Plan Note (Signed)
 Euvolemic today.  Continue management per cardiology.

## 2024-04-24 NOTE — Assessment & Plan Note (Signed)
 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  Recommended topical emollients.  She can try Allegra if needed as well.

## 2024-04-24 NOTE — Assessment & Plan Note (Signed)
 Not compliant with CPAP-likely contributing to her ongoing fatigue issues as above.

## 2024-04-24 NOTE — Assessment & Plan Note (Signed)
 Check lipids

## 2024-04-24 NOTE — Assessment & Plan Note (Signed)
 Check vitamin D.

## 2024-04-24 NOTE — Patient Instructions (Signed)
 It was very nice to see you today!  VISIT SUMMARY: During your visit, we discussed your fatigue, potential vitamin D  deficiency, and other health concerns. Blood work has been ordered to evaluate various deficiencies and overall health. We also addressed your chronic itchiness, leg pain, and sleep issues.  YOUR PLAN: FATIGUE AND EVALUATION FOR VITAMIN D  DEFICIENCY: You are experiencing persistent fatigue, which may be due to vitamin D  deficiency, anemia, thyroid  dysfunction, or B12 deficiency. -Blood work has been ordered to evaluate vitamin D , thyroid  function, and B12 levels.  PRURITUS: You have chronic itchiness, which may be due to a possible parasite infection. -Blood work has been ordered to screen for a possible parasite infection.  DIASTOLIC CONGESTIVE HEART FAILURE: Your recent cardiology evaluation found no cardiac cause for your shortness of breath, and your pulmonary function tests were normal. -Blood work has been ordered to assess your overall health status.  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 -Continue taking Lasix  as prescribed. -Increase physical activity, including walking and exercises to improve circulation.  OSTEOPOROSIS: You have concerns about medication side effects and the long-term presence of Fosamax  in the body. -Exercise and calcium/vitamin D  supplementation are recommended. -A bone density scan has been ordered.  OBSTRUCTIVE SLEEP APNEA: You are not using CPAP and report improved sleep but still experience fatigue. Your sleep duration remains low at four hours per night. -Increase sleep duration and quality.  GENERAL HEALTH MAINTENANCE: We discussed the flu vaccination, which you declined. -A bone density scan has been ordered.  Return in about 6 months (around 10/22/2024) for Follow Up.   Take care, Dr Kennyth  PLEASE  NOTE:  If you had any lab tests, please let us  know if you have not heard back within a few days. You may see your results on mychart before we have a chance to review them but we will give you a call once they are reviewed by us .   If we ordered any referrals today, please let us  know if you have not heard from their office within the next week.   If you had any urgent prescriptions sent in today, please check with the pharmacy within an hour of our visit to make sure the prescription was transmitted appropriately.   Please try these tips to maintain a healthy lifestyle:  Eat at least 3 REAL meals and 1-2 snacks per day.  Aim for no more than 5 hours between eating.  If you eat breakfast, please do so within one hour of getting up.   Each meal should contain half fruits/vegetables, one quarter protein, and one quarter carbs (no bigger than a computer mouse)  Cut down on sweet beverages. This includes juice, soda, and sweet tea.   Drink at least 1 glass of water with each meal and aim for at least 8 glasses per day  Exercise at least 150 minutes every week.

## 2024-04-24 NOTE — Assessment & Plan Note (Signed)
 Patient with significant physical deconditioning.  She is also been having some ongoing dyspnea on exertion with negative workup via cardiology recently.  We did discuss importance of increasing exercise and activity as tolerated.  She does admit that she is not as active as she could be and does see the benefit with increasing her physical activity.  We did discuss referral for either physical therapy or home health physical therapy however she declined.  She will let us  know if she changes her mind about referral.

## 2024-04-24 NOTE — Assessment & Plan Note (Signed)
Blood pressure at goal today without any medications.

## 2024-04-24 NOTE — Assessment & Plan Note (Signed)
 On Lasix  as needed.  Discussed importance of regular activity as above to help with this as well.  She can continue using compression garments and SCDs as needed as well.

## 2024-04-24 NOTE — Assessment & Plan Note (Signed)
 Due for repeat DEXA scan.  She could not tolerate the Fosamax  previously.  May consider referral to osteoporosis clinic for Prolia depending on results of upcoming DEXA scan.

## 2024-04-24 NOTE — Assessment & Plan Note (Signed)
 BMI 37.6 now with comorbidities.  Discussed importance of regular exercise as above.

## 2024-04-24 NOTE — Progress Notes (Signed)
 "  Katelyn Estes is a 89 y.o. female who presents today for an office visit.  Assessment/Plan:  New/Acute Problems: Other Fatigue  Multifactorial however likely related to her poor sleep (only getting 2-4 hours nightly and does not use CPAP for OSA) and deconditioning.  We did discuss importance of trying to aim for at least 7 to 8 hours of sleep nightly and importance of treating her OSA.  We also discussed importance of increasing exercise and activity as tolerated.  We will check labs today to rule out other potential factors.  She has recently had complete cardiac workup which has been reassuring by her cardiologist.  Chronic Problems Addressed Today: Physical deconditioning Patient with significant physical deconditioning.  She is also been having some ongoing dyspnea on exertion with negative workup via cardiology recently.  We did discuss importance of increasing exercise and activity as tolerated.  She does admit that she is not as active as she could be and does see the benefit with increasing her physical activity.  We did discuss referral for either physical therapy or home health physical therapy however she declined.  She will let us  know if she changes her mind about referral.  Essential hypertension Blood pressure at goal today without any medications.  Dyslipidemia Check lipids  Hyperglycemia Check A1c  Vitamin D  deficiency Check vitamin D   Pruritus This has been apersistent issue for her likely secondary to her xerosis cutis.  Recommended topical emollients.  She can try Allegra if needed as well.  OSA (obstructive sleep apnea) Not compliant with CPAP-likely contributing to her ongoing fatigue issues as above.  Diastolic congestive heart failure, unspecified HF chronicity (HCC) Euvolemic today.  Continue management per cardiology.  Obesity, morbid (HCC) BMI 37.6 now with comorbidities.  Discussed importance of regular exercise as above.  Lymphedema On Lasix  as  needed.  Discussed importance of regular activity as above to help with this as well.  She can continue using compression garments and SCDs as needed as well.  Osteoporosis Due for repeat DEXA scan.  She could not tolerate the Fosamax  previously.  May consider referral to osteoporosis clinic for Prolia depending on results of upcoming DEXA scan.  Preventative health care Vaccines declined.  Will order DEXA.    Subjective:  HPI:  See assessment / plan for status of chronic conditions.   Discussed the use of AI scribe software for clinical note transcription with the patient, who gave verbal consent to proceed.  History of Present Illness Katelyn Estes is a 89 year old female who presents with fatigue and concerns about vitamin D  deficiency.  She experiences persistent fatigue, feeling tired frequently, especially after minimal physical activity. She is concerned about potential vitamin D  deficiency and is interested in having blood work done to check this, along with other potential deficiencies such as B12.  She mentions an incident where she believes a gnat flew into her ear, causing persistent itchiness. A previous examination by her hearing aid provider did not reveal any foreign body in the ear. She is less itchy than before, attributing past itchiness to medication.  She has a history of heavy breathing and getting out of breath, which was evaluated by her cardiologist. She reports that her cardiologist told her that her heart is not the cause of her heavy breathing and getting out of breath. She has had a pulmonary function test, which was normal.  She experiences leg pain and describes her legs as feeling like 'hard banana, piano'. She  has lipoedema and takes Lasix  twice a day, which she times around her schedule to avoid frequent urination when outside. She uses a walker at home due to difficulty walking and osteoporosis.  Her sleep is limited to about four hours per night, which  is an improvement from two hours previously. She does not feel refreshed upon waking and has a long-standing habit of watching TV extensively. She is not currently using a CPAP machine.  She is concerned about osteoporosis and has read about the long-term presence of certain medications in the body. She is hesitant to take medication for osteoporosis due to past experiences with itching.         Objective:  Physical Exam: BP 112/80   Pulse 85   Temp (!) 97.1 F (36.2 C) (Temporal)   Ht 5' (1.524 m)   Wt 193 lb (87.5 kg)   LMP  (LMP Unknown)   SpO2 96%   BMI 37.69 kg/m   Wt Readings from Last 3 Encounters:  04/24/24 193 lb (87.5 kg)  04/11/24 200 lb (90.7 kg)  02/25/24 195 lb (88.5 kg)    Gen: No acute distress, resting comfortably HEENT: TMs clear bilaterally. CV: Regular rate and rhythm with no murmurs appreciated Pulm: Normal work of breathing, clear to auscultation bilaterally with no crackles, wheezes, or rhonchi Neuro: Grossly normal, moves all extremities Psych: Normal affect and thought content  Time Spent: 45 minutes of total time was spent on the date of the encounter performing the following actions: chart review prior to seeing the patient including recent visits with cardiology, obtaining history, performing a medically necessary exam, counseling on the treatment plan, placing orders, and documenting in our EHR.        Worth HERO. Kennyth, MD 04/24/2024 10:00 AM  "

## 2024-04-24 NOTE — Assessment & Plan Note (Signed)
 Check A1c.

## 2024-04-26 LAB — METHYLMALONIC ACID, SERUM: Methylmalonic Acid, Quant: 187 nmol/L (ref 85–423)

## 2024-04-27 ENCOUNTER — Ambulatory Visit: Payer: Self-pay | Admitting: Family Medicine

## 2024-04-27 NOTE — Progress Notes (Signed)
 Her B12 is on the lower range of normal.  This may be contributing some to her symptoms.  Recommend she start B12 protocol here.  We should recheck B12 in 3 to 6 months.  All of her other labs are at goal.

## 2024-05-03 ENCOUNTER — Ambulatory Visit (INDEPENDENT_AMBULATORY_CARE_PROVIDER_SITE_OTHER)

## 2024-05-03 DIAGNOSIS — E538 Deficiency of other specified B group vitamins: Secondary | ICD-10-CM

## 2024-05-03 MED ORDER — CYANOCOBALAMIN 1000 MCG/ML IJ SOLN
1000.0000 ug | Freq: Once | INTRAMUSCULAR | Status: AC
  Administered 2024-05-03: 1000 ug via INTRAMUSCULAR

## 2024-05-03 NOTE — Progress Notes (Cosign Needed Addendum)
 Patient is in office today for a nurse visit for B12 Injection. Patient Injection was given in the  Left deltoid. Patient tolerated injection well.

## 2024-05-04 ENCOUNTER — Other Ambulatory Visit: Payer: Self-pay | Admitting: Family Medicine

## 2024-05-04 DIAGNOSIS — Z1231 Encounter for screening mammogram for malignant neoplasm of breast: Secondary | ICD-10-CM

## 2024-05-05 ENCOUNTER — Telehealth: Payer: Self-pay | Admitting: *Deleted

## 2024-05-05 ENCOUNTER — Other Ambulatory Visit: Payer: Self-pay

## 2024-05-05 DIAGNOSIS — Z1231 Encounter for screening mammogram for malignant neoplasm of breast: Secondary | ICD-10-CM

## 2024-05-05 NOTE — Telephone Encounter (Signed)
 Order placed/ scheduled for 02/18 at [facility]

## 2024-05-05 NOTE — Telephone Encounter (Signed)
 Copied from CRM #[id number]. Topic: Appointments - Scheduling Inquiry for Clinic >> May 04, 2024  1:56 PM Antony RAMAN wrote: Reason for CRM: wanting to schedule mammogram for 2/18 >> May 04, 2024  2:01 PM Rock A wrote: Mammogram needs to be ordered    [facility] to send referral mammogram? [facility]

## 2024-05-05 NOTE — Telephone Encounter (Signed)
 Ok with me. Please place any necessary orders.

## 2024-05-09 NOTE — Progress Notes (Signed)
 Mammogram order placed

## 2024-05-10 ENCOUNTER — Ambulatory Visit

## 2024-05-11 ENCOUNTER — Ambulatory Visit

## 2024-05-11 ENCOUNTER — Telehealth: Payer: Self-pay | Admitting: *Deleted

## 2024-05-11 NOTE — Telephone Encounter (Signed)
 Copied from CRM #[id number]. Topic: Appointments - Scheduling Inquiry for Clinic >> May 11, 2024 10:29 AM Donna BRAVO wrote: Reason for CRM: Patient unable to come to appt today 05/11/24 wanting to reschedule B12 injection.  Patient would like to speak with a nurse regarding injections and rescheduling   Patient would like to speak with a nurse  Patient was informed they will receive a call back by end of business day

## 2024-05-12 NOTE — Telephone Encounter (Signed)
 Advised pt to continue as scheduled with B12 injections

## 2024-05-17 ENCOUNTER — Ambulatory Visit

## 2024-05-17 DIAGNOSIS — E538 Deficiency of other specified B group vitamins: Secondary | ICD-10-CM | POA: Diagnosis not present

## 2024-05-17 MED ORDER — CYANOCOBALAMIN 1000 MCG/ML IJ SOLN
1000.0000 ug | Freq: Once | INTRAMUSCULAR | Status: AC
  Administered 2024-05-17: 1000 ug via INTRAMUSCULAR

## 2024-05-17 NOTE — Progress Notes (Signed)
 Patient is in office today for a nurse visit for B12 Injection. Patient Injection was given in the  Left deltoid. Patient tolerated injection well.

## 2024-05-18 ENCOUNTER — Ambulatory Visit: Admitting: Podiatry

## 2024-05-24 ENCOUNTER — Ambulatory Visit

## 2024-05-31 ENCOUNTER — Ambulatory Visit

## 2024-06-08 ENCOUNTER — Ambulatory Visit: Admitting: Podiatry

## 2024-07-20 ENCOUNTER — Ambulatory Visit: Admitting: Podiatry

## 2024-07-31 ENCOUNTER — Ambulatory Visit

## 2024-10-23 ENCOUNTER — Ambulatory Visit: Admitting: Family Medicine
